# Patient Record
Sex: Female | Born: 1980 | Race: White | Hispanic: No | Smoking: Current every day smoker
Health system: Southern US, Community
[De-identification: ages and names within clinical notes are randomized; demographics above are authoritative.]

## PROBLEM LIST (undated history)

## (undated) DIAGNOSIS — N10 Acute tubulo-interstitial nephritis: Secondary | ICD-10-CM

## (undated) DIAGNOSIS — Z720 Tobacco use: Secondary | ICD-10-CM

## (undated) DIAGNOSIS — N390 Urinary tract infection, site not specified: Secondary | ICD-10-CM

## (undated) DIAGNOSIS — A0472 Enterocolitis due to Clostridium difficile, not specified as recurrent: Secondary | ICD-10-CM

## (undated) DIAGNOSIS — B9620 Unspecified Escherichia coli [E. coli] as the cause of diseases classified elsewhere: Secondary | ICD-10-CM

## (undated) DIAGNOSIS — I959 Hypotension, unspecified: Secondary | ICD-10-CM

## (undated) HISTORY — PX: TUBAL LIGATION: SHX77

---

## 1999-05-04 ENCOUNTER — Other Ambulatory Visit: Admission: RE | Admit: 1999-05-04 | Discharge: 1999-05-04 | Payer: Self-pay | Admitting: Family Medicine

## 1999-05-09 ENCOUNTER — Ambulatory Visit (HOSPITAL_COMMUNITY): Admission: RE | Admit: 1999-05-09 | Discharge: 1999-05-09 | Payer: Self-pay | Admitting: Family Medicine

## 1999-05-09 ENCOUNTER — Encounter: Payer: Self-pay | Admitting: Family Medicine

## 1999-07-25 ENCOUNTER — Encounter: Payer: Self-pay | Admitting: Family Medicine

## 1999-07-25 ENCOUNTER — Ambulatory Visit (HOSPITAL_COMMUNITY): Admission: RE | Admit: 1999-07-25 | Discharge: 1999-07-25 | Payer: Self-pay | Admitting: Family Medicine

## 1999-07-30 ENCOUNTER — Inpatient Hospital Stay (HOSPITAL_COMMUNITY): Admission: AD | Admit: 1999-07-30 | Discharge: 1999-07-30 | Payer: Self-pay | Admitting: Family Medicine

## 1999-09-18 ENCOUNTER — Inpatient Hospital Stay (HOSPITAL_COMMUNITY): Admission: AD | Admit: 1999-09-18 | Discharge: 1999-09-18 | Payer: Self-pay | Admitting: Family Medicine

## 1999-09-18 ENCOUNTER — Encounter: Payer: Self-pay | Admitting: Family Medicine

## 1999-10-16 ENCOUNTER — Inpatient Hospital Stay (HOSPITAL_COMMUNITY): Admission: AD | Admit: 1999-10-16 | Discharge: 1999-10-23 | Payer: Self-pay | Admitting: Family Medicine

## 1999-10-17 ENCOUNTER — Encounter: Payer: Self-pay | Admitting: Family Medicine

## 1999-11-22 ENCOUNTER — Encounter: Payer: Self-pay | Admitting: Family Medicine

## 1999-11-22 ENCOUNTER — Ambulatory Visit (HOSPITAL_COMMUNITY): Admission: RE | Admit: 1999-11-22 | Discharge: 1999-11-22 | Payer: Self-pay | Admitting: Family Medicine

## 1999-12-26 ENCOUNTER — Inpatient Hospital Stay (HOSPITAL_COMMUNITY): Admission: AD | Admit: 1999-12-26 | Discharge: 1999-12-28 | Payer: Self-pay | Admitting: Family Medicine

## 2001-09-23 ENCOUNTER — Other Ambulatory Visit: Admission: RE | Admit: 2001-09-23 | Discharge: 2001-09-23 | Payer: Self-pay | Admitting: *Deleted

## 2002-02-22 ENCOUNTER — Observation Stay (HOSPITAL_COMMUNITY): Admission: AD | Admit: 2002-02-22 | Discharge: 2002-02-23 | Payer: Self-pay | Admitting: Gynecology

## 2002-03-05 ENCOUNTER — Inpatient Hospital Stay (HOSPITAL_COMMUNITY): Admission: AD | Admit: 2002-03-05 | Discharge: 2002-03-08 | Payer: Self-pay | Admitting: Gynecology

## 2004-05-21 ENCOUNTER — Emergency Department (HOSPITAL_COMMUNITY): Admission: EM | Admit: 2004-05-21 | Discharge: 2004-05-21 | Payer: Self-pay | Admitting: Emergency Medicine

## 2004-08-20 ENCOUNTER — Emergency Department (HOSPITAL_COMMUNITY): Admission: EM | Admit: 2004-08-20 | Discharge: 2004-08-20 | Payer: Self-pay | Admitting: Emergency Medicine

## 2004-08-27 ENCOUNTER — Other Ambulatory Visit: Admission: RE | Admit: 2004-08-27 | Discharge: 2004-08-27 | Payer: Self-pay | Admitting: Obstetrics & Gynecology

## 2005-02-18 ENCOUNTER — Ambulatory Visit (HOSPITAL_COMMUNITY): Admission: AD | Admit: 2005-02-18 | Discharge: 2005-02-19 | Payer: Self-pay | Admitting: Obstetrics and Gynecology

## 2005-04-13 ENCOUNTER — Ambulatory Visit (HOSPITAL_COMMUNITY): Admission: AD | Admit: 2005-04-13 | Discharge: 2005-04-14 | Payer: Self-pay | Admitting: Obstetrics and Gynecology

## 2005-05-01 ENCOUNTER — Ambulatory Visit (HOSPITAL_COMMUNITY): Admission: AD | Admit: 2005-05-01 | Discharge: 2005-05-01 | Payer: Self-pay | Admitting: Obstetrics and Gynecology

## 2005-05-24 ENCOUNTER — Inpatient Hospital Stay (HOSPITAL_COMMUNITY): Admission: AD | Admit: 2005-05-24 | Discharge: 2005-05-26 | Payer: Self-pay | Admitting: Obstetrics and Gynecology

## 2005-07-24 ENCOUNTER — Ambulatory Visit (HOSPITAL_COMMUNITY): Admission: RE | Admit: 2005-07-24 | Discharge: 2005-07-24 | Payer: Self-pay | Admitting: Obstetrics & Gynecology

## 2005-08-11 ENCOUNTER — Emergency Department (HOSPITAL_COMMUNITY): Admission: EM | Admit: 2005-08-11 | Discharge: 2005-08-11 | Payer: Self-pay | Admitting: Emergency Medicine

## 2010-05-31 ENCOUNTER — Emergency Department (HOSPITAL_COMMUNITY): Admission: EM | Admit: 2010-05-31 | Discharge: 2010-05-31 | Payer: Self-pay | Admitting: Emergency Medicine

## 2010-09-19 ENCOUNTER — Emergency Department (HOSPITAL_COMMUNITY): Payer: Medicaid Other

## 2010-09-19 ENCOUNTER — Emergency Department (HOSPITAL_COMMUNITY)
Admission: EM | Admit: 2010-09-19 | Discharge: 2010-09-19 | Disposition: A | Discharge status: Home / Self Care | Payer: Medicaid Other | Attending: Emergency Medicine | Admitting: Emergency Medicine

## 2010-09-19 DIAGNOSIS — S91009A Unspecified open wound, unspecified ankle, initial encounter: Secondary | ICD-10-CM | POA: Insufficient documentation

## 2010-09-19 DIAGNOSIS — S51809A Unspecified open wound of unspecified forearm, initial encounter: Secondary | ICD-10-CM | POA: Insufficient documentation

## 2010-09-19 DIAGNOSIS — S81009A Unspecified open wound, unspecified knee, initial encounter: Secondary | ICD-10-CM | POA: Insufficient documentation

## 2010-09-19 DIAGNOSIS — Y92009 Unspecified place in unspecified non-institutional (private) residence as the place of occurrence of the external cause: Secondary | ICD-10-CM | POA: Insufficient documentation

## 2010-09-19 DIAGNOSIS — S31109A Unspecified open wound of abdominal wall, unspecified quadrant without penetration into peritoneal cavity, initial encounter: Secondary | ICD-10-CM | POA: Insufficient documentation

## 2010-09-19 DIAGNOSIS — W540XXA Bitten by dog, initial encounter: Secondary | ICD-10-CM | POA: Insufficient documentation

## 2010-12-07 NOTE — Op Note (Signed)
NAMESUSETTE, SEMINARA               ACCOUNT NO.:  [id number]   MEDICAL RECORD NO.:  [id number]          PATIENT TYPE:  AMB   LOCATION:  DAY                           FACILITY:  [facility]   PHYSICIAN:  Lazaro Arms, M.D.   DATE OF BIRTH:  [birth year]   DATE OF PROCEDURE:  07/24/2005  DATE OF DISCHARGE:                                 OPERATIVE REPORT   PREOPERATIVE DIAGNOSIS:  Multiparous female, desires permanent  sterilization.   POSTOPERATIVE DIAGNOSIS:  Multiparous female, desires permanent  sterilization.   OPERATION PERFORMED:  Laparoscopic tubal ligation.   SURGEON:  Lazaro Arms, M.D.   ANESTHESIA:  General endotracheal.   FINDINGS:  Normal uterus, tubes and ovaries.  Normal pelvis.   DESCRIPTION OF PROCEDURE:  The patient was taken to the operating room and  placed in supine position where she underwent general endotracheal  anesthesia, placed in dorsal lithotomy position, prepped and draped in the  usual sterile fashion.  Incision was made in the umbilicus.  A Veress needle  was placed.  The peritoneal cavity was insufflated under direct vision. A  nonbladed trocar was used and the peritoneal cavity was entered under direct  visualization with the video laparoscope.  Tubes were identified. They were  burned 3 cm in the distal isthmic and ampullary region of both tubes.  No  bleeding bilaterally.  There was normal uterus, tubes and ovaries.  The  instruments were removed, the gas was allowed to escape.  The fascia was  closed using 0 Vicryl.  The skin was closed using 3-0 Vicryl, Dermabond was  placed.  The patient tolerated the procedure well.  She experienced no blood  loss and was taken to recovery room in good stable condition.  All counts  were correct.      Lazaro Arms, M.D.  Electronically Signed     LHE/MEDQ  D:  07/24/2005  T:  07/24/2005  Job:  [id number]

## 2010-12-07 NOTE — Discharge Summary (Signed)
   [name], [name]                           ACCOUNT NO.:  [id number]   MEDICAL RECORD NO.:  [id number]                   PATIENT TYPE:  INP   LOCATION:  9114                                 FACILITY:  [facility]   PHYSICIAN:  Juan H. Lily Peer, M.D.             DATE OF BIRTH:  [birth year]   DATE OF ADMISSION:  03/05/2002  DATE OF DISCHARGE:  03/08/2002                                 DISCHARGE SUMMARY   DISCHARGE DIAGNOSIS:  Intrauterine pregnancy at term.   PROCEDURE:  Normal spontaneous vaginal delivery of viable infant over intact  perineum.   HISTORY OF PRESENT ILLNESS:  The patient is a 30 year old gravida 2 para 1-0-  0-1 with an LMP of  April 25, 2001; [facility] March 02, 2002.  Prenatal course  was complicated by late entry into prenatal care.   LABORATORY DATA:  Blood type A positive, antibody screen negative.  RPR,  HBsAg, HIV nonreactive.  GBS negative.   HOSPITAL COURSE AND TREATMENT:  The patient presented March 06, 2002 with  spontaneous onset of labor, advanced dilatation.  Cervix was 5 cm, 90%, -2  station.  She progressed to complete dilatation, delivered an Apgar 9 and 18  female infant weighing 8 pounds 13 ounces over an intact perineum.   POSTPARTUM COURSE:  The patient remained afebrile, had no difficulty  voiding, was able to be discharged in satisfactory condition on postpartum  day #2.  CBC:  Hematocrit 30.3, hemoglobin 10.3, wbc's 16.7, platelets 219.   DISPOSITION:  1. Follow up in six weeks.  2. Continue prenatal vitamins and iron.  3. Motrin and Tylox for pain.     Elwyn Lade . Hancock, N.P.                Gaetano Hawthorne. Lily Peer, M.D.    MKH/MEDQ  D:  04/16/2002  T:  04/17/2002  Job:  [id number]

## 2010-12-07 NOTE — H&P (Signed)
NAMEDILCIA, RYBARCZYK               ACCOUNT NO.:  [id number]   MEDICAL RECORD NO.:  [id number]          PATIENT TYPE:  AMB   LOCATION:  DAY                           FACILITY:  [facility]   PHYSICIAN:  Lazaro Arms, M.D.   DATE OF BIRTH:  1980/08/28   DATE OF ADMISSION:  DATE OF DISCHARGE:  LH                                HISTORY & PHYSICAL   HISTORY:  Tonya Stewart is a 30 year old white female, gravida 4, para 3, abortus  1, who recently delivered May 24, 2005.  She has requested permanent  sterilization and wants a tubal ligation.  She understands the permanent  nature of the procedure.   PAST MEDICAL HISTORY:  The past medical history is negative.   PAST SURGICAL HISTORY:  The past surgical history is negative.   PAST OBSTETRICAL HISTORY:  The patient has had three vaginal deliveries and  a miscarriage.   ALLERGIES:  The patient has no known drug allergies.   MEDICATIONS:  The patient is on no medications.   REVIEW OF SYSTEMS:  The review of systems is otherwise negative.   PHYSICAL EXAMINATION:  HEENT:  The head, eyes, ears, nose and throat is  unremarkable.  NECK:  The thyroid is normal.  LUNGS:  The lungs are clear.  HEART:  The heart shows a regular rate and rhythm without regurgitation or  gallop.  BREASTS:  The breasts are without mass, discharge or skin changes.  ABDOMEN:  The abdomen is benign with no hepatosplenomegaly or masses.  PELVIC:  The patient has normal external genitalia.  EXTREMITIES:  The extremities are warm with no edema.  NEUROLOGIC:  The neurological exam is grossly intact.   IMPRESSION:  1.  Multiparous female.  2.  Desires permanent sterilization.   PLAN:  The patient is admitted for a laparoscopic tubal ligation.   The patient understands the permanent of the procedure and the failure rate  of 1 in 300.      Lazaro Arms, M.D.  Electronically Signed     LHE/MEDQ  D:  07/23/2005  T:  07/24/2005  Job:  [id number]

## 2010-12-07 NOTE — Discharge Summary (Signed)
[facility]  Patient:    Tonya Stewart, Tonya Stewart                        MRN: [id number] Adm. Date:  [id number] Disc. Date: [id number] Attending:  Orpah Greek                           Discharge Summary  DATE OF BIRTH:                [birth year]  ADMISSION DIAGNOSIS:          A 30 year old G1, P0 at 30-1/7 weeks with preterm  labor.  DISCHARGE DIAGNOSES:          1. A 30 year old G1, P0 at 32 weeks.                               2. Preterm labor, stable.  PROCEDURES:                   Ultrasound October 17, 1999 revealing a single living IUP, mean gestation age 82 weeks and 5 days (2 weeks 3 days ahead of LMP) estimated fetal weight of 1974 g and an AFI of 25.2 cm.  DISCHARGE INSTRUCTIONS:       1. The patient was discharged home on strict bedrest                                  with shower and bathroom privileges only.                               2. She was to follow up in the office in one week,                                  sooner p.r.n. greater than four uterine                                  contractions per hour or increasing pelvic                                  pressure.  She was to follow pelvic rest, as well                                  as bedrest.  DISCHARGE MEDICATIONS:        1. Procardia XL 60 mg p.o. q.p.m. at 4 oclock                               2. Ambien 10 mg p.o. q.h.s. p.r.n.                               3. Paxil 10 mg p.o. q.a.m.  4. PNV p.o. q.d.                               5. Colace 100 mg p.o. q.d.  DISCHARGE SUMMARY:            A 30 year old G1, P0 at 30-1/7 weeks by LMP and confirmed by ultrasound at 7 weeks 2 days, referred for admission after she was  found to have cervical change during a routine office visit.  She had had episodes of uterine contractions three days prior to discharge and increased white vaginal discharge.  Pregnancy had already been complicated by  preterm cervical change earlier with some dilatation and developing fetal macrosomia.  A one-hour ______ had been normal at 88 and all other labs for the patient had p to this point been normal.  At the time of diagnosis, her cervical exam was a loose 1 cm anterior, soft, long and high.  She was admitted and started on Unasyn 3 g IV q.6h. because of her preterm status and the vaginal discharge, placed on bedrest with bathroom privileges, and given betamethasone 12.5 mg IM.  She was initially treated with terbutaline subcu x 1 but still continued to contract, and so she was started on magnesium sulfate in addition to the Unasyn.  She responded well to this and was maintained on this medication until 24 hours prior to discharge.  She was given betamethasone x 2 t the beginning of her hospitalization.  During the first 24 hours, her cervical exam changed to a loose 1 cm, dilated 60-80% effaced, and increased lower uterine segment development.  Presenting problem was ballotable.  This exam remained stable throughout the remainder of er hospitalization including the day of discharge.  Ultrasound did reveal accelerated fetal growth with a 2 week 5 day difference between her initial seven-week ultrasound and size noted on the ultrasound on October 17, 1999.  She did undergo a three-hour OGTT the third day of hospitalization that revealed elevated fasting  sugar of 108, one-hour of 177, two-hour of 166, and three-hour of 147.  Fasting  blood sugars throughout the remainder of her hospitalization remained less than 75 and it was thought the initial abnormal results were because of the steroid she was given.  The patient was switched to Procardia XL 60 mg q.d. on October 22, 1999. he did have several episodes of contractions in the afternoon, as many as nine contractions an hour.  This was not treated and did resolve spontaneously.  She  sustained no cervical change.  She was  discharged home on October 23, 1999 on strict bedrest, Procardia XL, and Paxil for depressive symptoms that had developed during her hospitalization.  She was  advised to limit her sweets and fat intake in her diet and to follow up in the office one week or sooner if needed.  During her hospitalization, her ______ strip status remained negative, RPR was negative, and her wet prep was negative.  A urine was also negative. DD:  10/23/99 TD:  10/25/99 Job: [id number]

## 2010-12-07 NOTE — Op Note (Signed)
NAMEBLAYNE, GARLICK               ACCOUNT NO.:  [id number]   MEDICAL RECORD NO.:  [id number]          PATIENT TYPE:  INP   LOCATION:  LDR1                          FACILITY:  [facility]   PHYSICIAN:  Tilda Burrow, M.D. DATE OF BIRTH:  27-Apr-1981   DATE OF PROCEDURE:  05/24/2005  DATE OF DISCHARGE:                                 OPERATIVE REPORT   PROCEDURE:  Continuous lumbar epidural catheter placed at 1:45 p.m. using  loss-of-resistance technique. We first had difficulty identifying the  interspace, and on the first time that we legitimately identified the  interspace, it was at the L3-4 interspace with loss of resistance  identifying the epidural space at 6 cm depth beneath the skin. A 5-cc test  dose of 1.5% lidocaine with epinephrine was instilled followed by insertion  of the epidural catheter 3.5 cm into the epidural space with taping of the  catheter to the back after removal of the Tuohy needle. The patient then had  continuous infusion initiated with good analgesic effect beginning at the  time of this dictation. The patient will receive a 12 cc continuous infusion  and will not require an additional bolus as she is laboring easily at the  current time.      Tilda Burrow, M.D.  Electronically Signed     JVF/MEDQ  D:  05/24/2005  T:  05/24/2005  Job:  [id number]

## 2010-12-07 NOTE — Op Note (Signed)
NAMESUSETTE, SEMINARA               ACCOUNT NO.:  [id number]   MEDICAL RECORD NO.:  [id number]          PATIENT TYPE:  INP   LOCATION:  LDR1                          FACILITY:  [facility]   PHYSICIAN:  Tilda Burrow, M.D. DATE OF BIRTH:  05/25/1981   DATE OF PROCEDURE:  05/24/2005  DATE OF DISCHARGE:                                 OPERATIVE REPORT   Tonya Stewart, R.N., went to put her scheduled Foley catheter after her  epidural, and when the catheter would not thread, she noticed the head to be  at about +3 station. I was called to attend the delivery, and soon as I came  over, she pushed twice and had a spontaneous vaginal delivery of a viable  female infant at 24. There was a nuchal cord x1 that I could not reduce  before delivery of the body, so the body was delivered through it. Weight is  6 pounds 14 ounces. Apgars are 9 and 9. Twenty units of Pitocin diluted in  1,000 cc of lactated ringers was infused rapidly IV. The placenta separated  spontaneously and delivered via controlled cord traction at 1500. It was  inspected and appears to be intact with a three-vessel cord. The fundus was  firm. Minimal blood loss was noted. The vagina was inspected, and no  lacerations were found. The epidural catheter was then removed with the blue  tip visualized as being intact.      Tonya Stewart, C.N.M.      Tilda Burrow, M.D.  Electronically Signed    FC/MEDQ  D:  05/24/2005  T:  05/24/2005  Job:  [id number]   cc:   [facility]

## 2010-12-07 NOTE — H&P (Signed)
Tonya Stewart               ACCOUNT NO.:  [id number]   MEDICAL RECORD NO.:  [id number]          PATIENT TYPE:  INP   LOCATION:  LDR1                          FACILITY:  [facility]   PHYSICIAN:  Tonya Stewart, M.D. DATE OF BIRTH:  01/04/1981   DATE OF ADMISSION:  05/24/2005  DATE OF DISCHARGE:  LH                                HISTORY & PHYSICAL   CHIEF COMPLAINT:  Regular contractions.   HISTORY OF PRESENT ILLNESS:  Tonya Stewart is a 30 year old gravida 4, para 2, AB 1  with EDC of June 03, 2005 based on last known menstrual period and first  and second trimester ultrasound's which correlate nicely placing her at 38  weeks, 4 days gestation.  She began prenatal care early in her first  trimester and has had regular visits throughout.  Her pregnancy course has  been basically uncomplicated.   LABORATORY DATA:  Prenatal labs blood type A positive, rubella immune.  HBsAg, HIV, HSV, RPR, Gonorrhea and Chlamydia are all negative.  Her Group B  Strep is also negative.   PRENATAL HISTORY:  Her blood pressures have been 100-130's/60-80's.  Total  weight gain has been 30 pounds with appropriate fundal height growth.   PAST MEDICAL HISTORY:  Noncontributory.   PAST SURGICAL HISTORY:  No surgical history.   HABITS:  Patient smokes about one-half pack per day.  Denies alcohol or  recreational drug use.   SOCIAL HISTORY:  She is separated from her husband.  She lives with Sharia Reeve,  the father of this baby.  Her two other children live with her ex-husband.   FAMILY HISTORY:  Her mother was diagnosed with breast cancer at age 24.   OBSTETRICAL HISTORY:  In 2001 she had an induced labor at 40 weeks and had a  spontaneous vaginal delivery of an 8 pounds, 4 ounces female.  That pregnancy  had been complicated by dilating to 4 cm real early on in the pregnancy, 30  weeks or so.  In August 2003 she also had some preterm cervical changes but  ended up delivering at about 40 weeks with spontaneous  vaginal delivery of  another female infant.  This one was 8 pounds, 14 ounces.  In 2004 she had a  TAB.   PHYSICAL EXAMINATION:  HEENT:  Within normal limits.  HEART:  Regular rate and rhythm.  LUNGS:  Clear.  ABDOMEN:  Soft, nontender.  She is having some mild irregular contractions.  Fetal heart rate is reactive without decelerations.  PELVIC:  Cervical examination at the office the other day was 3 cm, 80%, 0  station.  This morning, would give her 4 cm, feels a little thicker, maybe  50%, 0 to -1 with a bulging bag/membrane.   IMPRESSION:  Intrauterine pregnancy at 38-1/2 weeks, beginning active phase  labor.   PLAN:  Will see what she does this morning.  If she has not progressed a lot  by lunch time will go ahead and break her water and start Pitocin.      Tonya Stewart, C.N.M.      Tonya Stewart, M.D.  Electronically Signed    FC/MEDQ  D:  05/24/2005  T:  05/24/2005  Job:  [id number]

## 2010-12-22 ENCOUNTER — Emergency Department (HOSPITAL_COMMUNITY): Payer: BC Managed Care – PPO

## 2010-12-22 ENCOUNTER — Emergency Department (HOSPITAL_COMMUNITY)
Admission: EM | Admit: 2010-12-22 | Discharge: 2010-12-22 | Disposition: A | Discharge status: Home / Self Care | Payer: BC Managed Care – PPO | Attending: Emergency Medicine | Admitting: Emergency Medicine

## 2010-12-22 DIAGNOSIS — F329 Major depressive disorder, single episode, unspecified: Secondary | ICD-10-CM | POA: Insufficient documentation

## 2010-12-22 DIAGNOSIS — K5289 Other specified noninfective gastroenteritis and colitis: Secondary | ICD-10-CM | POA: Insufficient documentation

## 2010-12-22 DIAGNOSIS — F3289 Other specified depressive episodes: Secondary | ICD-10-CM | POA: Insufficient documentation

## 2010-12-22 DIAGNOSIS — Z79899 Other long term (current) drug therapy: Secondary | ICD-10-CM | POA: Insufficient documentation

## 2010-12-22 DIAGNOSIS — R10811 Right upper quadrant abdominal tenderness: Secondary | ICD-10-CM | POA: Insufficient documentation

## 2010-12-22 DIAGNOSIS — F411 Generalized anxiety disorder: Secondary | ICD-10-CM | POA: Insufficient documentation

## 2010-12-22 LAB — URINALYSIS, ROUTINE W REFLEX MICROSCOPIC
Ketones, ur: 15 mg/dL — AB
Leukocytes, UA: NEGATIVE
Nitrite: NEGATIVE
Protein, ur: 30 mg/dL — AB

## 2010-12-22 LAB — COMPREHENSIVE METABOLIC PANEL
Alkaline Phosphatase: 86 U/L (ref 39–117)
BUN: 13 mg/dL (ref 6–23)
CO2: 29 mEq/L (ref 19–32)
Chloride: 96 mEq/L (ref 96–112)
Creatinine, Ser: 0.78 mg/dL (ref 0.4–1.2)
GFR calc non Af Amer: >60 mL/min (ref >60)
Glucose, Bld: 103 mg/dL — ABNORMAL HIGH (ref 70–99)
Potassium: 3.6 mEq/L (ref 3.5–5.1)
Total Bilirubin: 0.8 mg/dL (ref 0.3–1.2)

## 2010-12-22 LAB — URINE MICROSCOPIC-ADD ON

## 2010-12-22 LAB — DIFFERENTIAL
Basophils Absolute: 0 10*3/uL (ref 0.0–0.1)
Basophils Relative: 0 % (ref 0–1)
Eosinophils Relative: 0 % (ref 0–5)
Monocytes Absolute: 0.8 10*3/uL (ref 0.1–1.0)
Neutro Abs: 10 10*3/uL — ABNORMAL HIGH (ref 1.7–7.7)

## 2010-12-22 LAB — LIPASE, BLOOD: Lipase: 15 U/L (ref 11–59)

## 2010-12-22 LAB — CBC
MCH: 32.9 pg (ref 26.0–34.0)
MCV: 95.2 fL (ref 78.0–100.0)
Platelets: 272 10*3/uL (ref 150–400)

## 2010-12-22 LAB — AMYLASE: Amylase: 34 U/L (ref 0–105)

## 2010-12-22 MED ORDER — IOHEXOL 300 MG/ML  SOLN
100.0000 mL | Freq: Once | INTRAMUSCULAR | Status: AC | PRN
  Administered 2010-12-22: 100 mL via INTRAVENOUS

## 2011-01-21 ENCOUNTER — Emergency Department (HOSPITAL_COMMUNITY)
Admission: EM | Admit: 2011-01-21 | Discharge: 2011-01-22 | Disposition: A | Discharge status: Home / Self Care | Payer: BC Managed Care – PPO | Attending: Emergency Medicine | Admitting: Emergency Medicine

## 2011-01-21 DIAGNOSIS — F1210 Cannabis abuse, uncomplicated: Secondary | ICD-10-CM | POA: Insufficient documentation

## 2011-01-21 DIAGNOSIS — Z79899 Other long term (current) drug therapy: Secondary | ICD-10-CM | POA: Insufficient documentation

## 2011-01-21 DIAGNOSIS — T07XXXA Unspecified multiple injuries, initial encounter: Secondary | ICD-10-CM | POA: Insufficient documentation

## 2011-01-22 ENCOUNTER — Emergency Department (HOSPITAL_COMMUNITY): Payer: BC Managed Care – PPO

## 2011-01-22 LAB — URINALYSIS, ROUTINE W REFLEX MICROSCOPIC
Glucose, UA: NEGATIVE mg/dL
Hgb urine dipstick: NEGATIVE
Leukocytes, UA: NEGATIVE
Protein, ur: 30 mg/dL — AB
Specific Gravity, Urine: 1.01 (ref 1.005–1.030)
Urobilinogen, UA: 0.2 mg/dL (ref 0.0–1.0)

## 2011-01-22 LAB — URINE MICROSCOPIC-ADD ON

## 2012-06-16 ENCOUNTER — Other Ambulatory Visit (HOSPITAL_COMMUNITY)
Admission: RE | Admit: 2012-06-16 | Discharge: 2012-06-16 | Disposition: A | Discharge status: Home / Self Care | Payer: Commercial Indemnity | Source: Ambulatory Visit | Attending: Unknown Physician Specialty | Admitting: Unknown Physician Specialty

## 2012-06-16 ENCOUNTER — Other Ambulatory Visit: Payer: Self-pay | Admitting: Nurse Practitioner

## 2012-06-16 ENCOUNTER — Other Ambulatory Visit (HOSPITAL_COMMUNITY)
Admission: RE | Admit: 2012-06-16 | Discharge: 2012-06-16 | Disposition: A | Discharge status: Home / Self Care | Payer: BC Managed Care – PPO | Source: Ambulatory Visit | Attending: Unknown Physician Specialty | Admitting: Unknown Physician Specialty

## 2012-06-16 DIAGNOSIS — R87619 Unspecified abnormal cytological findings in specimens from cervix uteri: Secondary | ICD-10-CM | POA: Insufficient documentation

## 2012-06-16 DIAGNOSIS — R87612 Low grade squamous intraepithelial lesion on cytologic smear of cervix (LGSIL): Secondary | ICD-10-CM | POA: Insufficient documentation

## 2012-12-08 ENCOUNTER — Emergency Department (HOSPITAL_COMMUNITY)
Admission: EM | Admit: 2012-12-08 | Discharge: 2012-12-08 | Disposition: A | Discharge status: Home / Self Care | Payer: Medicaid Other | Attending: Emergency Medicine | Admitting: Emergency Medicine

## 2012-12-08 ENCOUNTER — Encounter (HOSPITAL_COMMUNITY): Payer: Self-pay | Admitting: *Deleted

## 2012-12-08 DIAGNOSIS — Z9851 Tubal ligation status: Secondary | ICD-10-CM | POA: Insufficient documentation

## 2012-12-08 DIAGNOSIS — R05 Cough: Secondary | ICD-10-CM | POA: Insufficient documentation

## 2012-12-08 DIAGNOSIS — E860 Dehydration: Secondary | ICD-10-CM

## 2012-12-08 DIAGNOSIS — N390 Urinary tract infection, site not specified: Secondary | ICD-10-CM | POA: Insufficient documentation

## 2012-12-08 DIAGNOSIS — F17200 Nicotine dependence, unspecified, uncomplicated: Secondary | ICD-10-CM | POA: Insufficient documentation

## 2012-12-08 DIAGNOSIS — R112 Nausea with vomiting, unspecified: Secondary | ICD-10-CM | POA: Insufficient documentation

## 2012-12-08 DIAGNOSIS — R1110 Vomiting, unspecified: Secondary | ICD-10-CM

## 2012-12-08 DIAGNOSIS — R1084 Generalized abdominal pain: Secondary | ICD-10-CM | POA: Insufficient documentation

## 2012-12-08 DIAGNOSIS — R059 Cough, unspecified: Secondary | ICD-10-CM | POA: Insufficient documentation

## 2012-12-08 DIAGNOSIS — J3489 Other specified disorders of nose and nasal sinuses: Secondary | ICD-10-CM | POA: Insufficient documentation

## 2012-12-08 DIAGNOSIS — R197 Diarrhea, unspecified: Secondary | ICD-10-CM | POA: Insufficient documentation

## 2012-12-08 LAB — CBC WITH DIFFERENTIAL/PLATELET
Basophils Absolute: 0.1 10*3/uL (ref 0.0–0.1)
Basophils Relative: 0 % (ref 0–1)
Eosinophils Absolute: 0 10*3/uL (ref 0.0–0.7)
Eosinophils Relative: 0 % (ref 0–5)
HCT: 39.4 % (ref 36.0–46.0)
MCHC: 35 g/dL (ref 30.0–36.0)
MCV: 95.6 fL (ref 78.0–100.0)
Monocytes Absolute: 1.2 10*3/uL — ABNORMAL HIGH (ref 0.1–1.0)
Platelets: 275 10*3/uL (ref 150–400)
RDW: 12.5 % (ref 11.5–15.5)
WBC: 13.4 10*3/uL — ABNORMAL HIGH (ref 4.0–10.5)

## 2012-12-08 LAB — COMPREHENSIVE METABOLIC PANEL
ALT: 11 U/L (ref 0–35)
AST: 14 U/L (ref 0–37)
Albumin: 4.3 g/dL (ref 3.5–5.2)
Calcium: 9.9 mg/dL (ref 8.4–10.5)
Creatinine, Ser: 0.73 mg/dL (ref 0.50–1.10)
GFR calc non Af Amer: >90 mL/min (ref >90)
Sodium: 140 mEq/L (ref 135–145)
Total Protein: 7.3 g/dL (ref 6.0–8.3)

## 2012-12-08 LAB — URINALYSIS, ROUTINE W REFLEX MICROSCOPIC
Glucose, UA: NEGATIVE mg/dL
Specific Gravity, Urine: 1.015 (ref 1.005–1.030)
Urobilinogen, UA: 0.2 mg/dL (ref 0.0–1.0)
pH: 7.5 (ref 5.0–8.0)

## 2012-12-08 LAB — URINE MICROSCOPIC-ADD ON

## 2012-12-08 MED ORDER — DIPHENOXYLATE-ATROPINE 2.5-0.025 MG PO TABS: 1.0000 | ORAL_TABLET | Freq: Four times a day (QID) | ORAL | Status: DC | PRN

## 2012-12-08 MED ORDER — ONDANSETRON HCL 4 MG/2ML IJ SOLN
4.0000 mg | Freq: Once | INTRAMUSCULAR | Status: AC
  Administered 2012-12-08: 4 mg via INTRAVENOUS
  Filled 2012-12-08: qty 2

## 2012-12-08 MED ORDER — NITROFURANTOIN MONOHYD MACRO 100 MG PO CAPS: 100.0000 mg | ORAL_CAPSULE | Freq: Two times a day (BID) | ORAL | Status: DC

## 2012-12-08 MED ORDER — POTASSIUM CHLORIDE CRYS ER 20 MEQ PO TBCR
40.0000 meq | EXTENDED_RELEASE_TABLET | Freq: Once | ORAL | Status: AC
  Administered 2012-12-08: 40 meq via ORAL
  Filled 2012-12-08: qty 2

## 2012-12-08 MED ORDER — SODIUM CHLORIDE 0.9 % IV BOLUS (SEPSIS)
500.0000 mL | Freq: Once | INTRAVENOUS | Status: AC
  Administered 2012-12-08: 500 mL via INTRAVENOUS

## 2012-12-08 MED ORDER — SODIUM CHLORIDE 0.9 % IV BOLUS (SEPSIS)
1000.0000 mL | Freq: Once | INTRAVENOUS | Status: AC
  Administered 2012-12-08: 1000 mL via INTRAVENOUS

## 2012-12-08 MED ORDER — ONDANSETRON HCL 4 MG PO TABS: 4.0000 mg | ORAL_TABLET | Freq: Four times a day (QID) | ORAL | Status: DC

## 2012-12-08 NOTE — ED Provider Notes (Signed)
History     This chart was scribed for Gilda Crease, *, MD by Smitty Pluck, ED Scribe. The patient was seen in room APA19/APA19 and the patient's care was started at 11:17 AM.   CSN: [id number]  Arrival date & time 12/08/12  1046      Chief Complaint  Patient presents with  . Emesis    The history is provided by the patient and medical records. No language interpreter was used.   HPI Comments: Tonya Stewart is a 32 y.o. female who presents to the Emergency Department complaining of constant, moderate nausea onset 2 days ago. Pt reports that she has vomited multiple times since onset but vomiting has subsided today. She reports that she had diarrhea, mild generalized abdominal pain, cough and congestion. She denies known sick contact. Pt denies fever, chills, weakness, SOB and any other pain.    History reviewed. No pertinent past medical history.  Past Surgical History  Procedure Laterality Date  . Tubal ligation      History reviewed. No pertinent family history.  History  Substance Use Topics  . Smoking status: Current Every Day Smoker  . Smokeless tobacco: Not on file  . Alcohol Use: No    OB History   Grav Para Term Preterm Abortions TAB SAB Ect Mult Living                  Review of Systems  Constitutional: Negative for fever and chills.  HENT: Positive for congestion.   Respiratory: Positive for cough.   Gastrointestinal: Positive for nausea, vomiting, abdominal pain and diarrhea.  All other systems reviewed and are negative.    Allergies  Hydrocodone and Other  Home Medications  No current outpatient prescriptions on file.  BP 108/63  Pulse 71  Temp(Src) 98.2 F (36.8 C) (Oral)  Resp 20  Ht 5\' 4"  (1.626 m)  Wt 180 lb (81.647 kg)  BMI 30.88 kg/m2  SpO2 100%  LMP 12/07/2012  Physical Exam  Nursing note and vitals reviewed. Constitutional: She is oriented to person, place, and time. She appears well-developed and well-nourished. No  distress.  HENT:  Head: Normocephalic and atraumatic.  Right Ear: Hearing normal.  Left Ear: Hearing normal.  Nose: Nose normal.  Mouth/Throat: Mucous membranes are dry.  Eyes: Conjunctivae and EOM are normal. Pupils are equal, round, and reactive to light.  Neck: Normal range of motion. Neck supple.  Cardiovascular: Regular rhythm, S1 normal and S2 normal.  Exam reveals no gallop and no friction rub.   No murmur heard. Pulmonary/Chest: Effort normal and breath sounds normal. No respiratory distress. She exhibits no tenderness.  Abdominal: Soft. Normal appearance and bowel sounds are normal. There is no hepatosplenomegaly. There is no tenderness. There is no rebound, no guarding, no tenderness at McBurney's point and negative Murphy's sign. No hernia.  Musculoskeletal: Normal range of motion.  Neurological: She is alert and oriented to person, place, and time. She has normal strength. No cranial nerve deficit or sensory deficit. Coordination normal. GCS eye subscore is 4. GCS verbal subscore is 5. GCS motor subscore is 6.  Skin: Skin is warm, dry and intact. No rash noted. No cyanosis.  Psychiatric: She has a normal mood and affect. Her speech is normal and behavior is normal. Thought content normal.    ED Course  Procedures (including critical care time) DIAGNOSTIC STUDIES: Oxygen Saturation is 100% on room air, normal by my interpretation.    COORDINATION OF CARE: 11:19 AM Discussed ED  treatment with pt and pt agrees.     Labs Reviewed  CBC WITH DIFFERENTIAL - Abnormal; Notable for the following:    WBC 13.4 (*)    Neutro Abs 10.1 (*)    Monocytes Absolute 1.2 (*)    All other components within normal limits  COMPREHENSIVE METABOLIC PANEL - Abnormal; Notable for the following:    Potassium 2.8 (*)    Glucose, Bld 104 (*)    All other components within normal limits  URINALYSIS, ROUTINE W REFLEX MICROSCOPIC   No results found.   Diagnosis: 1. Vomiting 2. Diarrhea 3.  Dehydration 4. UTI    MDM  Presents to the ER for evaluation of nausea, vomiting, diarrhea. Patient has had symptoms for several days. She appeared clinically dehydrated he was aggressively hydrated here in the ER. Her blood work showed hypokalemia, initiation of oral replacement in the ER. Abdominal exam, however, was benign. She does not have any guarding or rebound. No right lower quadrant tenderness and no tenderness at McBurney's point. She is not expressing any right upper quadrant tenderness, negative Murphy's sign. Patient feeling much better after IV hydration. She'll be discharged with continued symptomatic treatment. Urinalysis does suggest infection. Will be treated.   I personally performed the services described in this documentation, which was scribed in my presence. The recorded information has been reviewed and is accurate.   Gilda Crease, MD 12/08/12 1415

## 2012-12-08 NOTE — ED Notes (Addendum)
Vomiting for 48 hours, diarrhea has stopped Seen at Urgent care last night night . Sent to [facility] ER , waited there 4 hours and left.  Concerned about dehydration .Body aches  Sprayed her home for fleas on Saturday

## 2012-12-08 NOTE — ED Notes (Addendum)
Pt c/o n/v for two days, was seen at urgent care yesterday,  is not any better, Dr. Blinda Leatherwood at bedside prior to RN, see edp assessment for further.

## 2012-12-08 NOTE — ED Notes (Signed)
Dr Pollina at bedside,  

## 2012-12-08 NOTE — ED Notes (Signed)
Second bag of NS started, pt still unable to obtain urine sample, pt and family updated on plan of care.

## 2012-12-10 LAB — URINE CULTURE

## 2012-12-11 ENCOUNTER — Encounter (HOSPITAL_COMMUNITY): Payer: Self-pay | Admitting: *Deleted

## 2012-12-11 ENCOUNTER — Emergency Department (HOSPITAL_COMMUNITY): Payer: Medicaid Other

## 2012-12-11 ENCOUNTER — Inpatient Hospital Stay (HOSPITAL_COMMUNITY)
Admission: EM | Admit: 2012-12-11 | Discharge: 2012-12-14 | DRG: 872 | Disposition: A | Discharge status: Home / Self Care | Payer: Medicaid Other | Attending: Internal Medicine | Admitting: Internal Medicine

## 2012-12-11 DIAGNOSIS — E876 Hypokalemia: Secondary | ICD-10-CM | POA: Diagnosis present

## 2012-12-11 DIAGNOSIS — R1110 Vomiting, unspecified: Secondary | ICD-10-CM

## 2012-12-11 DIAGNOSIS — B9620 Unspecified Escherichia coli [E. coli] as the cause of diseases classified elsewhere: Secondary | ICD-10-CM

## 2012-12-11 DIAGNOSIS — E860 Dehydration: Secondary | ICD-10-CM | POA: Diagnosis present

## 2012-12-11 DIAGNOSIS — R1084 Generalized abdominal pain: Secondary | ICD-10-CM | POA: Diagnosis present

## 2012-12-11 DIAGNOSIS — F17200 Nicotine dependence, unspecified, uncomplicated: Secondary | ICD-10-CM | POA: Diagnosis present

## 2012-12-11 DIAGNOSIS — A4151 Sepsis due to Escherichia coli [E. coli]: Principal | ICD-10-CM | POA: Diagnosis present

## 2012-12-11 DIAGNOSIS — I959 Hypotension, unspecified: Secondary | ICD-10-CM

## 2012-12-11 DIAGNOSIS — R112 Nausea with vomiting, unspecified: Secondary | ICD-10-CM | POA: Diagnosis present

## 2012-12-11 DIAGNOSIS — N10 Acute tubulo-interstitial nephritis: Secondary | ICD-10-CM

## 2012-12-11 DIAGNOSIS — R197 Diarrhea, unspecified: Secondary | ICD-10-CM | POA: Diagnosis present

## 2012-12-11 DIAGNOSIS — Z720 Tobacco use: Secondary | ICD-10-CM

## 2012-12-11 DIAGNOSIS — I498 Other specified cardiac arrhythmias: Secondary | ICD-10-CM | POA: Diagnosis present

## 2012-12-11 DIAGNOSIS — A419 Sepsis, unspecified organism: Secondary | ICD-10-CM | POA: Diagnosis present

## 2012-12-11 DIAGNOSIS — A498 Other bacterial infections of unspecified site: Secondary | ICD-10-CM

## 2012-12-11 DIAGNOSIS — N390 Urinary tract infection, site not specified: Secondary | ICD-10-CM

## 2012-12-11 HISTORY — DX: Unspecified Escherichia coli (E. coli) as the cause of diseases classified elsewhere: B96.20

## 2012-12-11 HISTORY — DX: Tobacco use: Z72.0

## 2012-12-11 HISTORY — DX: Urinary tract infection, site not specified: N39.0

## 2012-12-11 HISTORY — DX: Acute pyelonephritis: N10

## 2012-12-11 HISTORY — DX: Hypotension, unspecified: I95.9

## 2012-12-11 HISTORY — DX: Urinary tract infection, site not specified: B96.20

## 2012-12-11 LAB — CBC WITH DIFFERENTIAL/PLATELET
Basophils Relative: 0 % (ref 0–1)
Eosinophils Relative: 0 % (ref 0–5)
HCT: 44.2 % (ref 36.0–46.0)
Hemoglobin: 16.2 g/dL — ABNORMAL HIGH (ref 12.0–15.0)
Lymphs Abs: 1.6 10*3/uL (ref 0.7–4.0)
MCH: 33.5 pg (ref 26.0–34.0)
MCV: 91.5 fL (ref 78.0–100.0)
Monocytes Absolute: 0.7 10*3/uL (ref 0.1–1.0)
Monocytes Relative: 6 % (ref 3–12)
Neutro Abs: 9.4 10*3/uL — ABNORMAL HIGH (ref 1.7–7.7)
RBC: 4.83 MIL/uL (ref 3.87–5.11)

## 2012-12-11 LAB — COMPREHENSIVE METABOLIC PANEL
CO2: 32 mEq/L (ref 19–32)
Calcium: 9.8 mg/dL (ref 8.4–10.5)
Chloride: 93 mEq/L — ABNORMAL LOW (ref 96–112)
Creatinine, Ser: 0.86 mg/dL (ref 0.50–1.10)
GFR calc Af Amer: >90 mL/min (ref >90)
GFR calc non Af Amer: 88 mL/min — ABNORMAL LOW (ref >90)
Glucose, Bld: 101 mg/dL — ABNORMAL HIGH (ref 70–99)
Total Bilirubin: 0.5 mg/dL (ref 0.3–1.2)

## 2012-12-11 LAB — CBC
Hemoglobin: 13.3 g/dL (ref 12.0–15.0)
MCHC: 35.8 g/dL (ref 30.0–36.0)
RBC: 4.06 MIL/uL (ref 3.87–5.11)
WBC: 14.1 10*3/uL — ABNORMAL HIGH (ref 4.0–10.5)

## 2012-12-11 LAB — LACTIC ACID, PLASMA: Lactic Acid, Venous: 1.1 mmol/L (ref 0.5–2.2)

## 2012-12-11 LAB — MAGNESIUM: Magnesium: 2.3 mg/dL (ref 1.5–2.5)

## 2012-12-11 MED ORDER — ACETAMINOPHEN 325 MG PO TABS
650.0000 mg | ORAL_TABLET | Freq: Four times a day (QID) | ORAL | Status: DC | PRN
  Administered 2012-12-12: 650 mg via ORAL
  Filled 2012-12-11: qty 2

## 2012-12-11 MED ORDER — ENOXAPARIN SODIUM 40 MG/0.4ML ~~LOC~~ SOLN
40.0000 mg | SUBCUTANEOUS | Status: DC
  Administered 2012-12-11 – 2012-12-13 (×3): 40 mg via SUBCUTANEOUS
  Filled 2012-12-11 (×3): qty 0.4

## 2012-12-11 MED ORDER — METOCLOPRAMIDE HCL 5 MG/ML IJ SOLN
10.0000 mg | Freq: Once | INTRAMUSCULAR | Status: AC
  Administered 2012-12-11: 10 mg via INTRAVENOUS
  Filled 2012-12-11: qty 2

## 2012-12-11 MED ORDER — POTASSIUM CHLORIDE IN NACL 40-0.9 MEQ/L-% IV SOLN
INTRAVENOUS | Status: DC
  Administered 2012-12-11: 150 mL/h via INTRAVENOUS
  Administered 2012-12-12: 01:00:00 via INTRAVENOUS
  Administered 2012-12-12: 150 mL/h via INTRAVENOUS
  Administered 2012-12-12 – 2012-12-13 (×2): via INTRAVENOUS
  Administered 2012-12-13: 75 mL/h via INTRAVENOUS

## 2012-12-11 MED ORDER — ONDANSETRON HCL 4 MG/2ML IJ SOLN
4.0000 mg | Freq: Four times a day (QID) | INTRAMUSCULAR | Status: DC
  Administered 2012-12-11 – 2012-12-14 (×10): 4 mg via INTRAVENOUS
  Filled 2012-12-11 (×10): qty 2

## 2012-12-11 MED ORDER — SODIUM CHLORIDE 0.9 % IV BOLUS (SEPSIS)
500.0000 mL | Freq: Once | INTRAVENOUS | Status: AC
  Administered 2012-12-11: 500 mL via INTRAVENOUS

## 2012-12-11 MED ORDER — SODIUM CHLORIDE 0.9 % IV SOLN: INTRAVENOUS | Status: DC

## 2012-12-11 MED ORDER — ONDANSETRON HCL 4 MG/2ML IJ SOLN
4.0000 mg | Freq: Once | INTRAMUSCULAR | Status: AC
  Administered 2012-12-11: 4 mg via INTRAVENOUS
  Filled 2012-12-11: qty 2

## 2012-12-11 MED ORDER — DEXTROSE 5 % IV SOLN
INTRAVENOUS | Status: AC
  Administered 2012-12-11: 1 g via INTRAVENOUS
  Filled 2012-12-11: qty 10

## 2012-12-11 MED ORDER — DEXTROSE 5 % IV SOLN: 1.0000 g | Freq: Once | INTRAVENOUS | Status: DC

## 2012-12-11 MED ORDER — ACETAMINOPHEN 650 MG RE SUPP: 650.0000 mg | Freq: Four times a day (QID) | RECTAL | Status: DC | PRN

## 2012-12-11 MED ORDER — DEXTROSE 5 % IV SOLN: 1.0000 g | Freq: Once | INTRAVENOUS | Status: AC

## 2012-12-11 MED ORDER — PANTOPRAZOLE SODIUM 40 MG IV SOLR
40.0000 mg | Freq: Once | INTRAVENOUS | Status: AC
  Administered 2012-12-11: 40 mg via INTRAVENOUS
  Filled 2012-12-11: qty 40

## 2012-12-11 MED ORDER — PANTOPRAZOLE SODIUM 40 MG IV SOLR
40.0000 mg | INTRAVENOUS | Status: DC
  Administered 2012-12-12 – 2012-12-13 (×2): 40 mg via INTRAVENOUS
  Filled 2012-12-11 (×2): qty 40

## 2012-12-11 MED ORDER — KETOROLAC TROMETHAMINE 30 MG/ML IJ SOLN
15.0000 mg | Freq: Four times a day (QID) | INTRAMUSCULAR | Status: DC | PRN
  Filled 2012-12-11: qty 1

## 2012-12-11 MED ORDER — POTASSIUM CHLORIDE 10 MEQ/100ML IV SOLN
10.0000 meq | Freq: Once | INTRAVENOUS | Status: AC
  Administered 2012-12-11: 10 meq via INTRAVENOUS
  Filled 2012-12-11: qty 100

## 2012-12-11 MED ORDER — KETOROLAC TROMETHAMINE 30 MG/ML IJ SOLN
15.0000 mg | Freq: Four times a day (QID) | INTRAMUSCULAR | Status: DC | PRN
  Administered 2012-12-12 – 2012-12-13 (×3): 15 mg via INTRAVENOUS
  Filled 2012-12-11 (×3): qty 1

## 2012-12-11 MED ORDER — POTASSIUM CHLORIDE IN NACL 40-0.9 MEQ/L-% IV SOLN
INTRAVENOUS | Status: DC
  Administered 2012-12-11: 125 mL/h via INTRAVENOUS
  Filled 2012-12-11 (×4): qty 1000

## 2012-12-11 MED ORDER — SODIUM CHLORIDE 0.9 % IV BOLUS (SEPSIS)
1000.0000 mL | Freq: Once | INTRAVENOUS | Status: AC
  Administered 2012-12-11: 1000 mL via INTRAVENOUS

## 2012-12-11 MED ORDER — ONDANSETRON HCL 4 MG PO TABS: 4.0000 mg | ORAL_TABLET | Freq: Four times a day (QID) | ORAL | Status: DC

## 2012-12-11 MED ORDER — IOHEXOL 300 MG/ML  SOLN
50.0000 mL | Freq: Once | INTRAMUSCULAR | Status: AC | PRN
  Administered 2012-12-11: 50 mL via ORAL

## 2012-12-11 MED ORDER — IOHEXOL 300 MG/ML  SOLN
100.0000 mL | Freq: Once | INTRAMUSCULAR | Status: AC | PRN
  Administered 2012-12-11: 100 mL via INTRAVENOUS

## 2012-12-11 MED ORDER — PROMETHAZINE HCL 25 MG/ML IJ SOLN
12.5000 mg | Freq: Four times a day (QID) | INTRAMUSCULAR | Status: DC | PRN
  Administered 2012-12-11 – 2012-12-14 (×3): 12.5 mg via INTRAVENOUS
  Filled 2012-12-11 (×3): qty 1

## 2012-12-11 MED ORDER — HYDROMORPHONE HCL PF 1 MG/ML IJ SOLN
1.0000 mg | INTRAMUSCULAR | Status: DC | PRN
  Administered 2012-12-11: 1 mg via INTRAVENOUS
  Filled 2012-12-11: qty 1

## 2012-12-11 MED ORDER — DEXTROSE 5 % IV SOLN
1.0000 g | INTRAVENOUS | Status: DC
  Administered 2012-12-12 – 2012-12-13 (×2): 1 g via INTRAVENOUS
  Filled 2012-12-11 (×3): qty 10

## 2012-12-11 NOTE — ED Notes (Signed)
Seen here on the 20th and dx with dehydration, Vomiting, diarrhea, and uti. Pt states she is no better. Pt states discomfort to chest when taking a deep breath. Vomiting x 6 days. Unable to keep meds down.

## 2012-12-11 NOTE — H&P (Signed)
[facility] History and Physical  Tonya Stewart ZOX:[id number] DOB: 04/26/1981 DOA: 12/11/2012  Referring physician: ED physician, Dr. Aileen Stewart PCP: Tonya Ribas, MD  Specialists:   Chief Complaint: Nausea, vomiting, abdominal pain.  HPI: Tonya Stewart is a 32 y.o. female with no significant past medical history, who presents to the emergency department with a 6 day history of persistent nausea and vomiting. Over the past few days, she has vomited more than 20 times. She initially presented to the emergency department on 12/08/2012 for nausea, vomiting, and abdominal pain. She was found to have a urinary tract infection. She was discharged on Macrodantin. Since that time, she is taking very little Macrodantin because of nausea and vomiting. She had one episode of coffee grounds emesis, but the subsequent episodes revealed just clear fluid. She has had associated suprapubic abdominal pain and bilateral flank pain, greater on the right. Her pain is described as crampy and intermittently sharp. It is mostly 10 over 10 in intensity. Nothing makes the pain worse or better. She had one loose stool yesterday, otherwise she denies diarrhea. She has regular menstrual periods. She is sexually active and does not use barrier protection. She is status post tubal ligation. She denies vaginal discharge or history of STDs.  In the emergency department, she is afebrile and hemodynamically stable. Her lab data are significant for a WBC of 11.7, hemoglobin of 16.2, potassium of 2.7. CT of her abdomen and pelvis revealed no acute abnormalities. The urine culture from 12/08/2012 reveals greater than 100,000 colonies of Escherichia coli, intermediate to nitrofurantoin. She is being admitted for further evaluation and management.   Review of Systems: As above in history present illness, otherwise negative.   History reviewed. No pertinent past medical history. Past Surgical History  Procedure Laterality  Date  . Tubal ligation     Social History: She is divorced. She lives in [place]. She has 3 children. She is unemployed. She smokes a pack of cigarettes per day. She smokes marijuana on occasion. She denies alcohol use.  Allergies  Allergen Reactions  . Flagyl (Metronidazole)     hives  . Hydrocodone Itching and Nausea And Vomiting   Family history: Both of her parents are alive and they have no significant medical history.  Prior to Admission medications   Medication Sig Start Date End Date Taking? Authorizing Provider  diphenoxylate-atropine (LOMOTIL) 2.5-0.025 MG per tablet Take 1 tablet by mouth 4 (four) times daily as needed for diarrhea or loose stools. 12/08/12  Yes Tonya Crease, MD  nitrofurantoin, macrocrystal-monohydrate, (MACROBID) 100 MG capsule Take 100 mg by mouth 2 (two) times daily. X 7 days 12/08/12  Yes Tonya Crease, MD  ondansetron (ZOFRAN) 4 MG tablet Take 4 mg by mouth every 6 (six) hours as needed for nausea. 12/08/12  Yes Tonya Crease, MD  oxyCODONE-acetaminophen (PERCOCET/ROXICET) 5-325 MG per tablet Take 1 tablet by mouth every 4 (four) hours as needed for pain.   Yes Historical Provider, MD   Physical Exam: Filed Vitals:   12/11/12 1152 12/11/12 1542  BP: 116/71 119/61  Pulse: 83 76  Temp: 97.2 F (36.2 C)   TempSrc: Oral   Resp: 16 16  Height: 5\' 4"  (1.626 m)   Weight: 81.647 kg (180 lb)   SpO2: 100%      General:  32 year old Caucasian woman lying in bed, who appears ill.  Eyes: Pupils are equal, round, and reactive to light. Extraocular movements are intact. Conjunctivae are clear. Sclerae are  white.  ENT: Oropharynx reveals mildly dry mucous membranes. She has a pierced ring in her tongue. No surrounding erythema. No posterior exudates.  Neck: Supple, no adenopathy, no thyromegaly, no JVD.  Cardiovascular: S1, S2, with a soft systolic murmur.  Respiratory: Clear to auscultation bilaterally.  Abdomen: Positive  bowel sounds, very soft, mildly obese, diffusely tender moderately. Mild bilateral flank tenderness.  Skin: Good turgor.  Musculoskeletal: No acute hot red joints. No pedal edema. Pedal pulses palpable.  Psychiatric: She appears to be in pain with grimacing with abdominal exam which seems to be exaggerated. Her speech is clear.  Neurologic: Cranial nerves II through XII are intact. Strength is 5 over 5 throughout. Sensation is intact.  Labs on Admission:  Basic Metabolic Panel:  Recent Labs Lab 12/08/12 1136 12/11/12 1212  NA 140 139  K 2.8* 2.7*  CL 96 93*  CO2 32 32  GLUCOSE 104* 101*  BUN 12 9  CREATININE 0.73 0.86  CALCIUM 9.9 9.8  MG  --  2.3   Liver Function Tests:  Recent Labs Lab 12/08/12 1136 12/11/12 1212  AST 14 13  ALT 11 14  ALKPHOS 58 61  BILITOT 0.3 0.5  PROT 7.3 7.5  ALBUMIN 4.3 4.5   No results found for this basename: LIPASE, AMYLASE,  in the last 168 hours No results found for this basename: AMMONIA,  in the last 168 hours CBC:  Recent Labs Lab 12/08/12 1136 12/11/12 1212  WBC 13.4* 11.7*  NEUTROABS 10.1* 9.4*  HGB 13.8 16.2*  HCT 39.4 44.2  MCV 95.6 91.5  PLT 275 327   Cardiac Enzymes: No results found for this basename: CKTOTAL, CKMB, CKMBINDEX, TROPONINI,  in the last 168 hours  BNP (last 3 results) No results found for this basename: PROBNP,  in the last 8760 hours CBG: No results found for this basename: GLUCAP,  in the last 168 hours  Radiological Exams on Admission: Ct Abdomen Pelvis W Contrast  12/11/2012   *RADIOLOGY REPORT*  Clinical Data: Abdominal pain with nausea and fever.  CT ABDOMEN AND PELVIS WITH CONTRAST  Technique:  Multidetector CT imaging of the abdomen and pelvis was performed following the standard protocol during bolus administration of intravenous contrast.  Contrast: OMNIPAQUE IOHEXOL 300 MG/ML  SOLN  Comparison: 12/22/2010.  Findings: The liver, spleen, pancreas, adrenal glands, kidneys, and biliary  tree are normal except for a small focal area of fatty infiltration in the left lobe of the liver adjacent to the falciform ligament, unchanged.  This is of no clinical significance.  The bowel is normal including the terminal ileum and appendix. Uterus and ovaries are normal.  No free air or free fluid.  No osseous abnormality.  IMPRESSION: Benign-appearing abdomen and pelvis.   Original Report Authenticated By: Francene Boyers, M.D.    EKG:   Assessment/Plan Principal Problem:   Nausea and vomiting Active Problems:   Acute pyelonephritis   E. coli UTI   Hypokalemia   Abdominal pain, generalized   Dehydration   78. 32 year old woman who presents with ongoing nausea, vomiting, suprapubic abdominal pain, and flank pain. She has failed outpatient treatment. Her urine culture from 5/20 reveals Escherichia coli. This histology is consistent with acute pyelonephritis, although the CT scan of her abdomen and pelvis is unremarkable. Her abdominal exam yields possibly exaggerated or possibly embellished tenderness. Her abdomen is completely soft. She has generalized dehydration as evident by her elevated hemoglobin. Her hypokalemia is  secondary to vomiting.     Plan:  1. The patient received Rocephin in the emergency department. We'll continue Rocephin. 2. She received Zofran for nausea. We'll get her scheduled Zofran IV every 6 hours and when necessary IV Phenergan. 3. Protonix was given in the emergency department. We'll continue with daily. 4. We'll allow sips and ice chips, but hold on diet. 5. Supportive treatment with analgesics. 6. The patient received one IV potassium runs. Will at potassium to maintenance IV fluids. We'll hydrate aggressively. 7. We'll check a blood magnesium level to rule out deficiency.     Code Status: Full code Family Communication: No family available Disposition Plan: Anticipate discharge to home in 48-72 hours.  Time spent: One  hour.  [facility] Pager [phone]  If 7PM-7AM, please contact night-coverage [email or web address] Password [facility] 12/11/2012, 3:51 PM

## 2012-12-11 NOTE — ED Notes (Signed)
CRITICAL VALUE ALERT  Critical value received: 2.7 potassium  Date of notification:  12/11/12  Time of notification:  1325  Critical value read back: yes  Nurse who received alert: j.  Bing Quarry  MD notified (1st page):  zammit  Time of first page:  1326  MD notified (2nd page):  Time of second page:  Responding MD:  zammit  Time MD responded: 1326

## 2012-12-11 NOTE — ED Provider Notes (Signed)
History  This chart was scribed for Benny Lennert, MD by Ardelia Mems, ED Scribe. This patient was seen in room APA07/APA07 and the patient's care was started at 12:05 PM.   CSN: [id number]  Arrival date & time 12/11/12  1143   Chief Complaint  Patient presents with  . Emesis  . Abdominal Pain    Patient is a 32 y.o. female presenting with vomiting and abdominal pain. The history is provided by the patient. No language interpreter was used.  Emesis Severity:  Moderate Duration:  5 days Timing:  Intermittent Progression:  Worsening Chronicity:  New Associated symptoms: no abdominal pain, no diarrhea and no headaches   Abdominal Pain This is a new problem. The current episode started more than 2 days ago. The problem occurs constantly. The problem has been gradually worsening. Pertinent negatives include no chest pain, no abdominal pain and no headaches.    HPI Comments: Tonya Stewart is a 32 y.o. female who presents to the Emergency Department complaining of gradual onset, gradually worsening, constant, moderate emesis and abdominal pain that began 5 days ago. Pt states that she has had multiple episodes of vomiting per day and has been unable to eat or sleep. Pt also reports constant, moderate chest pain that is worsened with inspiration. Pt reports associated fever, chills and abdominal cramps. Pt was seen here on 12/08/12 and diagnosed with dehydration, vomiting, diarrhea and UTI. She states that she is unable to keep her meds down and is not feeling better. Pt denies HA, confusion, back pain or any other symptoms. Pt denies alcohol use and is a current every day smoker.  History reviewed. No pertinent past medical history.  Past Surgical History  Procedure Laterality Date  . Tubal ligation      No family history on file.  History  Substance Use Topics  . Smoking status: Current Every Day Smoker  . Smokeless tobacco: Not on file  . Alcohol Use: No    OB History    Grav Para Term Preterm Abortions TAB SAB Ect Mult Living                  Review of Systems  Constitutional: Negative for appetite change and fatigue.  HENT: Negative for congestion, sinus pressure and ear discharge.   Eyes: Negative for discharge.  Respiratory: Negative for cough.   Cardiovascular: Negative for chest pain.  Gastrointestinal: Positive for vomiting. Negative for abdominal pain and diarrhea.  Genitourinary: Negative for frequency and hematuria.  Musculoskeletal: Negative for back pain.  Skin: Negative for rash.  Neurological: Negative for seizures and headaches.  Psychiatric/Behavioral: Negative for hallucinations.    Allergies  Flagyl and Hydrocodone  Home Medications   Current Outpatient Rx  Name  Route  Sig  Dispense  Refill  . acetaminophen (TYLENOL ARTHRITIS PAIN) 650 MG CR tablet   Oral   Take 1,300 mg by mouth every 8 (eight) hours as needed for pain.         . diphenoxylate-atropine (LOMOTIL) 2.5-0.025 MG per tablet   Oral   Take 1 tablet by mouth 4 (four) times daily as needed for diarrhea or loose stools.   30 tablet   0   . nitrofurantoin, macrocrystal-monohydrate, (MACROBID) 100 MG capsule   Oral   Take 1 capsule (100 mg total) by mouth 2 (two) times daily. X 7 days   14 capsule   0   . ondansetron (ZOFRAN) 4 MG tablet   Oral   Take 1  tablet (4 mg total) by mouth every 6 (six) hours.   12 tablet   0   . ondansetron (ZOFRAN-ODT) 4 MG disintegrating tablet   Oral   Take 4 mg by mouth once. Administered at [facility]         . oxyCODONE (OXY IR/ROXICODONE) 5 MG immediate release tablet   Oral   Take 5 mg by mouth once.           Triage Vitals:BP 116/71  Pulse 83  Temp(Src) 97.2 F (36.2 C) (Oral)  Resp 16  Ht 5\' 4"  (1.626 m)  Wt 180 lb (81.647 kg)  BMI 30.88 kg/m2  SpO2 100%  LMP 12/07/2012  Physical Exam  Constitutional: She is oriented to person, place, and time. She appears well-developed.   HENT:  Head: Normocephalic.  Mucous membranes are dry.  Eyes: Conjunctivae and EOM are normal. No scleral icterus.  Neck: Neck supple. No thyromegaly present.  Cardiovascular: Normal rate and regular rhythm.  Exam reveals no gallop and no friction rub.   No murmur heard. Pulmonary/Chest: No stridor. She has no wheezes. She has no rales. She exhibits no tenderness.  Abdominal: She exhibits no distension. There is tenderness. There is no rebound.  Moderate tenderness throughout abdomen.  Musculoskeletal: Normal range of motion. She exhibits no edema.  Lymphadenopathy:    She has no cervical adenopathy.  Neurological: She is oriented to person, place, and time. Coordination normal.  Skin: Rash noted. No erythema.  Rash above lip.  Psychiatric: She has a normal mood and affect. Her behavior is normal.    ED Course  Procedures (including critical care time)  DIAGNOSTIC STUDIES: Oxygen Saturation is 100% on RA, normal by my interpretation.    COORDINATION OF CARE: 12:10 PM- Pt advised of plan for treatment and pt agrees.  Medications  sodium chloride 0.9 % bolus 1,000 mL (1,000 mLs Intravenous New Bag/Given 12/11/12 1233)  ondansetron (ZOFRAN) injection 4 mg (4 mg Intravenous Given 12/11/12 1233)  pantoprazole (PROTONIX) injection 40 mg (40 mg Intravenous Given 12/11/12 1234)      Labs Reviewed  CBC WITH DIFFERENTIAL  COMPREHENSIVE METABOLIC PANEL  PREGNANCY, URINE   No results found.   No diagnosis found.    MDM        The chart was scribed for me under my direct supervision.  I personally performed the history, physical, and medical decision making and all procedures in the evaluation of this patient.Benny Lennert, MD 12/11/12 1515

## 2012-12-12 DIAGNOSIS — R197 Diarrhea, unspecified: Secondary | ICD-10-CM | POA: Diagnosis present

## 2012-12-12 DIAGNOSIS — A419 Sepsis, unspecified organism: Secondary | ICD-10-CM | POA: Diagnosis present

## 2012-12-12 LAB — CBC
HCT: 33.9 % — ABNORMAL LOW (ref 36.0–46.0)
Hemoglobin: 11.9 g/dL — ABNORMAL LOW (ref 12.0–15.0)
MCH: 32.7 pg (ref 26.0–34.0)
MCHC: 35.1 g/dL (ref 30.0–36.0)

## 2012-12-12 LAB — COMPREHENSIVE METABOLIC PANEL
ALT: 9 U/L (ref 0–35)
AST: 8 U/L (ref 0–37)
Alkaline Phosphatase: 40 U/L (ref 39–117)
CO2: 26 mEq/L (ref 19–32)
Calcium: 7.7 mg/dL — ABNORMAL LOW (ref 8.4–10.5)
Chloride: 107 mEq/L (ref 96–112)
GFR calc non Af Amer: >90 mL/min (ref >90)
Potassium: 3.6 mEq/L (ref 3.5–5.1)
Sodium: 139 mEq/L (ref 135–145)

## 2012-12-12 LAB — LIPASE, BLOOD: Lipase: 22 U/L (ref 11–59)

## 2012-12-12 LAB — CLOSTRIDIUM DIFFICILE BY PCR: Toxigenic C. Difficile by PCR: NEGATIVE

## 2012-12-12 MED ORDER — SODIUM CHLORIDE 0.9 % IV BOLUS (SEPSIS)
500.0000 mL | Freq: Once | INTRAVENOUS | Status: AC
  Administered 2012-12-12: 500 mL via INTRAVENOUS

## 2012-12-12 MED ORDER — OXYCODONE HCL 5 MG PO TABS
5.0000 mg | ORAL_TABLET | ORAL | Status: DC | PRN
  Administered 2012-12-12 – 2012-12-14 (×10): 5 mg via ORAL
  Filled 2012-12-12 (×10): qty 1

## 2012-12-12 MED ORDER — BOOST / RESOURCE BREEZE PO LIQD
1.0000 | Freq: Three times a day (TID) | ORAL | Status: DC
  Administered 2012-12-12 – 2012-12-14 (×4): 1 via ORAL

## 2012-12-12 NOTE — Progress Notes (Signed)
Subjective: Events noted overnight for the patient's blood pressure falling into the 80s systolically. Treated appropriately with IV fluid boluses. Also reportedly, the patient has had diarrhea. This patient says that she is feeling a little bit better and would like to try clear liquids.  Objective: Vital signs in last 24 hours: Filed Vitals:   12/12/12 0213 12/12/12 0336 12/12/12 0604 12/12/12 0857  BP: 90/38 102/44 86/47 109/64  Pulse: 49 56 65 61  Temp:   98.2 F (36.8 C)   TempSrc:      Resp: 16 16 18    Height:      Weight:      SpO2:   99% 99%    Intake/Output Summary (Last 24 hours) at 12/12/12 1119 Last data filed at 12/12/12 0847  Gross per 24 hour  Intake 2481.67 ml  Output      8 ml  Net 2473.67 ml    Weight change:   Physical exam: General: 32 year old Caucasian woman laying in bed, in no acute distress. She appears ill. Lungs: Clear to auscultation bilaterally. Heart: S1, S2, with no murmurs rubs or gallops. Abdomen: Mildly obese, positive bowel sounds, very soft, mildly and diffusely tender without rebound guarding or distention. Extremities: No pedal edema. Neurologic: She is alert and oriented x3.  Lab Results: Basic Metabolic Panel:  Recent Labs  78/29/56 1212 12/12/12 0804  NA 139 139  K 2.7* 3.6  CL 93* 107  CO2 32 26  GLUCOSE 101* 96  BUN 9 6  CREATININE 0.86 0.72  CALCIUM 9.8 7.7*  MG 2.3  --    Liver Function Tests:  Recent Labs  12/11/12 1212 12/12/12 0804  AST 13 8  ALT 14 9  ALKPHOS 61 40  BILITOT 0.5 0.4  PROT 7.5 5.0*  ALBUMIN 4.5 2.8*    Recent Labs  12/12/12 0804  LIPASE 22   No results found for this basename: AMMONIA,  in the last 72 hours CBC:  Recent Labs  12/11/12 1212 12/11/12 2120 12/12/12 0804  WBC 11.7* 14.1* 8.1  NEUTROABS 9.4*  --   --   HGB 16.2* 13.3 11.9*  HCT 44.2 37.1 33.9*  MCV 91.5 91.4 93.1  PLT 327 281 225   Cardiac Enzymes: No results found for this basename: CKTOTAL, CKMB,  CKMBINDEX, TROPONINI,  in the last 72 hours BNP: No results found for this basename: PROBNP,  in the last 72 hours D-Dimer: No results found for this basename: DDIMER,  in the last 72 hours CBG: No results found for this basename: GLUCAP,  in the last 72 hours Hemoglobin A1C: No results found for this basename: HGBA1C,  in the last 72 hours Fasting Lipid Panel: No results found for this basename: CHOL, HDL, LDLCALC, TRIG, CHOLHDL, LDLDIRECT,  in the last 72 hours Thyroid Function Tests: No results found for this basename: TSH, T4TOTAL, FREET4, T3FREE, THYROIDAB,  in the last 72 hours Anemia Panel: No results found for this basename: VITAMINB12, FOLATE, FERRITIN, TIBC, IRON, RETICCTPCT,  in the last 72 hours Coagulation: No results found for this basename: LABPROT, INR,  in the last 72 hours Urine Drug Screen: Drugs of Abuse  No results found for this basename: labopia,  cocainscrnur,  labbenz,  amphetmu,  thcu,  labbarb    Alcohol Level: No results found for this basename: ETH,  in the last 72 hours Urinalysis: No results found for this basename: COLORURINE, APPERANCEUR, LABSPEC, PHURINE, GLUCOSEU, HGBUR, BILIRUBINUR, KETONESUR, PROTEINUR, UROBILINOGEN, NITRITE, LEUKOCYTESUR,  in the last 72  hours Misc. Labs:   Micro: Recent Results (from the past 240 hour(s))  URINE CULTURE     Status: None   Collection Time    12/08/12  1:10 PM      Result Value Range Status   Specimen Description URINE, CLEAN CATCH   Final   Special Requests NONE   Final   Culture  Setup Time 12/09/2012 01:25   Final   Colony Count >=100,000 COLONIES/ML   Final   Culture ESCHERICHIA COLI   Final   Report Status 12/10/2012 FINAL   Final   Organism ID, Bacteria ESCHERICHIA COLI   Final  CLOSTRIDIUM DIFFICILE BY PCR     Status: None   Collection Time    12/12/12  2:30 AM      Result Value Range Status   C difficile by pcr NEGATIVE  NEGATIVE Final    Studies/Results: Ct Abdomen Pelvis W  Contrast  12/11/2012   *RADIOLOGY REPORT*  Clinical Data: Abdominal pain with nausea and fever.  CT ABDOMEN AND PELVIS WITH CONTRAST  Technique:  Multidetector CT imaging of the abdomen and pelvis was performed following the standard protocol during bolus administration of intravenous contrast.  Contrast: OMNIPAQUE IOHEXOL 300 MG/ML  SOLN  Comparison: 12/22/2010.  Findings: The liver, spleen, pancreas, adrenal glands, kidneys, and biliary tree are normal except for a small focal area of fatty infiltration in the left lobe of the liver adjacent to the falciform ligament, unchanged.  This is of no clinical significance.  The bowel is normal including the terminal ileum and appendix. Uterus and ovaries are normal.  No free air or free fluid.  No osseous abnormality.  IMPRESSION: Benign-appearing abdomen and pelvis.   Original Report Authenticated By: Francene Boyers, M.D.    Medications:  Scheduled: . cefTRIAXone (ROCEPHIN)  IV  1 g Intravenous Q24H  . enoxaparin (LOVENOX) injection  40 mg Subcutaneous Q24H  . ondansetron (ZOFRAN) IV  4 mg Intravenous Q6H  . pantoprazole (PROTONIX) IV  40 mg Intravenous Q24H   Continuous: . 0.9 % NaCl with KCl 40 mEq / L 150 mL/hr at 12/12/12 1610   RUE:[id number], acetaminophen, HYDROmorphone (DILAUDID) injection, ketorolac, ketorolac, oxyCODONE, promethazine  Assessment: Principal Problem:   Nausea and vomiting Active Problems:   Acute pyelonephritis   E. coli UTI   Hypokalemia   Abdominal pain, generalized   Sepsis   Dehydration   Diarrhea  1. Acute pyelonephritis with sepsis secondary to Escherichia coli. Failed outpatient treatment with nitrofurantoin. Her white blood cell count has normalized. She is afebrile. Her lactic acid is within normal limits. We'll continue Rocephin.  Hypotension, likely secondary to sepsis and hypovolemia/dehydration. Continue aggressive IV fluids. Her blood pressure is better.  Nausea and vomiting with abdominal  pain. Likely secondary to #1. Continue supportive treatment and PPI IV.  Diarrhea. C. difficile PCR pending.  Hypokalemia. Secondary to GI symptomatology. Repleting in the IV fluids.  Mild bradycardia.  Plan:  1. Will advance her diet to clear liquids. 2. Continue supportive treatment. 3. We'll order blood cultures if she becomes febrile. 4. We'll check the results of C. difficile PCR pending.   LOS: 1 day   Nealie Mchatton 12/12/2012, 11:19 AM

## 2012-12-12 NOTE — Progress Notes (Signed)
INITIAL NUTRITION ASSESSMENT  DOCUMENTATION CODES Per approved criteria  -Obesity Unspecified   INTERVENTION: Resource Breeze po TID, each supplement provides 250 kcal and 9 grams of protein.  NUTRITION DIAGNOSIS: Inadequate oral food intake related to altered GI function as evidenced by ongoing n/v, diet just advanced to clears.   Goal: Pt will meet >/= 90% of estimated energy needs.   Monitor:  Diet advancement, PO intake, labs, wt changes, skin integrity, changes in status  Reason for Assessment: MST=2  32 y.o. female  Admitting Dx: Nausea and vomiting  ASSESSMENT: Chart reviewed. Pt unavailable at time of visit.  Pt admitted with pyelonephritis secondary to E. Coli. Pt has been n/v for the past 6 days. C-diff results pending.  Pt has just been upgraded to clear liquid diet; no PO intake data available at this time.   Height: Ht Readings from Last 1 Encounters:  12/11/12 5\' 4"  (1.626 m)    Weight: Wt Readings from Last 1 Encounters:  12/11/12 180 lb (81.647 kg)    Ideal Body Weight: 120#  % Ideal Body Weight: 150%  Wt Readings from Last 10 Encounters:  12/11/12 180 lb (81.647 kg)  12/08/12 180 lb (81.647 kg)    Usual Body Weight: 180#  % Usual Body Weight: 100%  BMI:  Body mass index is 30.88 kg/(m^2). Meets criteria for obesity, class I.   Estimated Nutritional Needs: Kcal: 1500-1600 daily Protein: 66-82 grams daily Fluid: 1.5-1.6 L daily  Skin: no skin issues noted  Diet Order: Clear Liquid  EDUCATION NEEDS: -Education not appropriate at this time   Intake/Output Summary (Last 24 hours) at 12/12/12 1245 Last data filed at 12/12/12 0847  Gross per 24 hour  Intake 2481.67 ml  Output      8 ml  Net 2473.67 ml    Last BM: 12/12/12   Labs:   Recent Labs Lab 12/08/12 1136 12/11/12 1212 12/12/12 0804  NA 140 139 139  K 2.8* 2.7* 3.6  CL 96 93* 107  CO2 32 32 26  BUN 12 9 6   CREATININE 0.73 0.86 0.72  CALCIUM 9.9 9.8 7.7*  MG  --   2.3  --   GLUCOSE 104* 101* 96    CBG (last 3)  No results found for this basename: GLUCAP,  in the last 72 hours  Scheduled Meds: . cefTRIAXone (ROCEPHIN)  IV  1 g Intravenous Q24H  . enoxaparin (LOVENOX) injection  40 mg Subcutaneous Q24H  . ondansetron (ZOFRAN) IV  4 mg Intravenous Q6H  . pantoprazole (PROTONIX) IV  40 mg Intravenous Q24H    Continuous Infusions: . 0.9 % NaCl with KCl 40 mEq / L 150 mL/hr at 12/12/12 1610    History reviewed. No pertinent past medical history.  Past Surgical History  Procedure Laterality Date  . Tubal ligation      Melody Haver, RD, LDN Pager: [phone]

## 2012-12-12 NOTE — Progress Notes (Signed)
Notified Dr. Sherrie Mustache of pts low BP of 92/44, BP has been low throughout the night and today, no new orders at this time. Sheryn Bison

## 2012-12-12 NOTE — Progress Notes (Signed)
Patient's BP has remained low throughout the night. Boluses given as per orders. Patient has reported significant pain. Dilaudid held d/t low BP. Toradol ordered and given with little if any relief.

## 2012-12-13 ENCOUNTER — Encounter (HOSPITAL_COMMUNITY): Payer: Self-pay | Admitting: Internal Medicine

## 2012-12-13 DIAGNOSIS — Z720 Tobacco use: Secondary | ICD-10-CM

## 2012-12-13 DIAGNOSIS — R1084 Generalized abdominal pain: Secondary | ICD-10-CM

## 2012-12-13 HISTORY — DX: Tobacco use: Z72.0

## 2012-12-13 LAB — CBC
MCH: 33.2 pg (ref 26.0–34.0)
MCV: 95.4 fL (ref 78.0–100.0)
Platelets: 256 10*3/uL (ref 150–400)
RDW: 12.3 % (ref 11.5–15.5)

## 2012-12-13 LAB — BASIC METABOLIC PANEL
CO2: 25 mEq/L (ref 19–32)
Calcium: 8 mg/dL — ABNORMAL LOW (ref 8.4–10.5)
Creatinine, Ser: 0.73 mg/dL (ref 0.50–1.10)
Glucose, Bld: 86 mg/dL (ref 70–99)

## 2012-12-13 MED ORDER — NICOTINE 21 MG/24HR TD PT24
21.0000 mg | MEDICATED_PATCH | Freq: Every day | TRANSDERMAL | Status: DC
  Administered 2012-12-13: 21 mg via TRANSDERMAL
  Filled 2012-12-13 (×2): qty 1

## 2012-12-13 MED ORDER — LOPERAMIDE HCL 2 MG PO CAPS
2.0000 mg | ORAL_CAPSULE | Freq: Three times a day (TID) | ORAL | Status: DC
  Administered 2012-12-13 – 2012-12-14 (×4): 2 mg via ORAL
  Filled 2012-12-13 (×4): qty 1

## 2012-12-13 MED ORDER — POTASSIUM CHLORIDE IN NACL 20-0.9 MEQ/L-% IV SOLN
INTRAVENOUS | Status: DC
  Administered 2012-12-13: 14:00:00 via INTRAVENOUS

## 2012-12-13 NOTE — Progress Notes (Signed)
Pt has slight, non pitting edema in left hand/wrist area. Dr. Sherrie Mustache requested that IV be removed and a new site attained because pt is not yet ready for po medications. I made 1 attempt and was successful, but pt later complained of some tightness and pain at the site. Earnstine Regal, RN made 1 unsuccessful attempt, as did Norva Karvonen, Charity fundraiser. Trenton Gammon, RN from [place] was on the floor and offered to look, she was able to gain a very good site in the right [facility]. Sheryn Bison

## 2012-12-13 NOTE — Progress Notes (Signed)
Subjective: She has less pain, less nausea, and less diarrhea.  Objective: Vital signs in last 24 hours: Filed Vitals:   12/12/12 1729 12/12/12 2259 12/13/12 0257 12/13/12 0701  BP: 93/42 99/43 107/55 106/52  Pulse:  67 56 74  Temp:  98 F (36.7 C) 98.7 F (37.1 C) 98 F (36.7 C)  TempSrc:  Oral Oral Oral  Resp:  18 20 20   Height:      Weight:      SpO2:  99% 99% 99%    Intake/Output Summary (Last 24 hours) at 12/13/12 1334 Last data filed at 12/13/12 0559  Gross per 24 hour  Intake   3420 ml  Output   2850 ml  Net    570 ml    Weight change:   Physical exam: General: 32 year old Caucasian woman laying in bed, in no acute distress. She appears less ill. Lungs: Clear to auscultation bilaterally. Heart: S1, S2, with no murmurs rubs or gallops. Abdomen: Mildly obese, positive bowel sounds, very minimal hypogastric tenderness without rebound guarding or distention. Extremities: No pedal edema. Neurologic: She is alert and oriented x3.  Lab Results: Basic Metabolic Panel:  Recent Labs  45/40/98 1212 12/12/12 0804 12/13/12 0722  NA 139 139 141  K 2.7* 3.6 4.7  CL 93* 107 112  CO2 32 26 25  GLUCOSE 101* 96 86  BUN 9 6 3*  CREATININE 0.86 0.72 0.73  CALCIUM 9.8 7.7* 8.0*  MG 2.3  --   --    Liver Function Tests:  Recent Labs  12/11/12 1212 12/12/12 0804  AST 13 8  ALT 14 9  ALKPHOS 61 40  BILITOT 0.5 0.4  PROT 7.5 5.0*  ALBUMIN 4.5 2.8*    Recent Labs  12/12/12 0804  LIPASE 22   No results found for this basename: AMMONIA,  in the last 72 hours CBC:  Recent Labs  12/11/12 1212  12/12/12 0804 12/13/12 0722  WBC 11.7*  < > 8.1 8.8  NEUTROABS 9.4*  --   --   --   HGB 16.2*  < > 11.9* 12.9  HCT 44.2  < > 33.9* 37.1  MCV 91.5  < > 93.1 95.4  PLT 327  < > 225 256  < > = values in this interval not displayed. Cardiac Enzymes: No results found for this basename: CKTOTAL, CKMB, CKMBINDEX, TROPONINI,  in the last 72 hours BNP: No results  found for this basename: PROBNP,  in the last 72 hours D-Dimer: No results found for this basename: DDIMER,  in the last 72 hours CBG: No results found for this basename: GLUCAP,  in the last 72 hours Hemoglobin A1C: No results found for this basename: HGBA1C,  in the last 72 hours Fasting Lipid Panel: No results found for this basename: CHOL, HDL, LDLCALC, TRIG, CHOLHDL, LDLDIRECT,  in the last 72 hours Thyroid Function Tests: No results found for this basename: TSH, T4TOTAL, FREET4, T3FREE, THYROIDAB,  in the last 72 hours Anemia Panel: No results found for this basename: VITAMINB12, FOLATE, FERRITIN, TIBC, IRON, RETICCTPCT,  in the last 72 hours Coagulation: No results found for this basename: LABPROT, INR,  in the last 72 hours Urine Drug Screen: Drugs of Abuse  No results found for this basename: labopia,  cocainscrnur,  labbenz,  amphetmu,  thcu,  labbarb    Alcohol Level: No results found for this basename: ETH,  in the last 72 hours Urinalysis: No results found for this basename: COLORURINE, APPERANCEUR, LABSPEC, PHURINE, GLUCOSEU,  HGBUR, BILIRUBINUR, KETONESUR, PROTEINUR, UROBILINOGEN, NITRITE, LEUKOCYTESUR,  in the last 72 hours Misc. Labs:   Micro: Recent Results (from the past 240 hour(s))  URINE CULTURE     Status: None   Collection Time    12/08/12  1:10 PM      Result Value Range Status   Specimen Description URINE, CLEAN CATCH   Final   Special Requests NONE   Final   Culture  Setup Time 12/09/2012 01:25   Final   Colony Count >=100,000 COLONIES/ML   Final   Culture ESCHERICHIA COLI   Final   Report Status 12/10/2012 FINAL   Final   Organism ID, Bacteria ESCHERICHIA COLI   Final  CLOSTRIDIUM DIFFICILE BY PCR     Status: None   Collection Time    12/12/12  2:30 AM      Result Value Range Status   C difficile by pcr NEGATIVE  NEGATIVE Final    Studies/Results: Ct Abdomen Pelvis W Contrast  12/11/2012   *RADIOLOGY REPORT*  Clinical Data: Abdominal pain  with nausea and fever.  CT ABDOMEN AND PELVIS WITH CONTRAST  Technique:  Multidetector CT imaging of the abdomen and pelvis was performed following the standard protocol during bolus administration of intravenous contrast.  Contrast: OMNIPAQUE IOHEXOL 300 MG/ML  SOLN  Comparison: 12/22/2010.  Findings: The liver, spleen, pancreas, adrenal glands, kidneys, and biliary tree are normal except for a small focal area of fatty infiltration in the left lobe of the liver adjacent to the falciform ligament, unchanged.  This is of no clinical significance.  The bowel is normal including the terminal ileum and appendix. Uterus and ovaries are normal.  No free air or free fluid.  No osseous abnormality.  IMPRESSION: Benign-appearing abdomen and pelvis.   Original Report Authenticated By: Francene Boyers, M.D.    Medications:  Scheduled: . cefTRIAXone (ROCEPHIN)  IV  1 g Intravenous Q24H  . enoxaparin (LOVENOX) injection  40 mg Subcutaneous Q24H  . feeding supplement  1 Container Oral TID BM  . loperamide  2 mg Oral TID  . nicotine  21 mg Transdermal Daily  . ondansetron (ZOFRAN) IV  4 mg Intravenous Q6H  . pantoprazole (PROTONIX) IV  40 mg Intravenous Q24H   Continuous: . 0.9 % NaCl with KCl 40 mEq / L 75 mL/hr (12/13/12 0745)   ZOX:[id number], acetaminophen, HYDROmorphone (DILAUDID) injection, ketorolac, oxyCODONE, promethazine  Assessment: Principal Problem:   Nausea and vomiting Active Problems:   Acute pyelonephritis   E. coli UTI   Hypokalemia   Abdominal pain, generalized   Sepsis   Dehydration   Diarrhea  1. Acute pyelonephritis with sepsis secondary to Escherichia coli. Failed outpatient treatment with nitrofurantoin. Her white blood cell count has normalized. She is afebrile. Her lactic acid is within normal limits. We'll continue Rocephin.  Hypotension, likely secondary to sepsis and hypovolemia/dehydration.  Her blood pressure is better. We'll decrease IV fluids.  Nausea and  vomiting, diarrhea with abdominal pain. Likely secondary to #1. Resolving. Continue supportive treatment and PPI.  Diarrhea. C. difficile PCR negative.  Hypokalemia. Secondary to GI symptomatology. Repleting in the IV fluids.  Mild bradycardia. TSH pending.  Tobacco abuse.  Plan:  1. Advance diet to low residue. 2. Change Protonix to oral route. 3. Decreased IV fluids which have been done. 4. Tobacco cessation counseling. We'll place a nicotine patch.   LOS: 2 days   Tonya Stewart 12/13/2012, 1:34 PM

## 2012-12-13 NOTE — Progress Notes (Signed)
Pt was having some general agitation and anxiety, largely related to pt wanting to leave the floor and smoke. I explained that she was not able to do that at this time, as I was going to start her IV ABX. I explained to her that she could leave AMA if she so desired, but that I do not recommend this. Pt then began to calm down, and agrees to stay. Sheryn Bison

## 2012-12-13 NOTE — Progress Notes (Signed)
Pt had 50cc of dark green liquid stool.

## 2012-12-13 NOTE — Progress Notes (Addendum)
1255: Pt complains that left arm, (where her original IV was located) is tight and states that it is her arm, not just her hand, which she states is new, heat packs applied to arm. Will monitor. 2pm: pt states that her arm is feeling better at this time. Dr. Sherrie Mustache made aware, order received to keep arm elevated and keep warm compresses on it. Sheryn Bison

## 2012-12-13 NOTE — Progress Notes (Signed)
Late entry for 12/12/12. Pt complained of swelling in her left hand. IV was flushed, blood return noted, and no pain at the site. Nursing to monitor site. Sheryn Bison

## 2012-12-14 ENCOUNTER — Encounter (HOSPITAL_COMMUNITY): Payer: Self-pay | Admitting: Internal Medicine

## 2012-12-14 LAB — BASIC METABOLIC PANEL
BUN: 4 mg/dL — ABNORMAL LOW (ref 6–23)
CO2: 27 mEq/L (ref 19–32)
Calcium: 8.2 mg/dL — ABNORMAL LOW (ref 8.4–10.5)
GFR calc non Af Amer: >90 mL/min (ref >90)
Glucose, Bld: 95 mg/dL (ref 70–99)
Sodium: 140 mEq/L (ref 135–145)

## 2012-12-14 MED ORDER — PROMETHAZINE HCL 12.5 MG PO TABS: 12.5000 mg | ORAL_TABLET | Freq: Four times a day (QID) | ORAL | Status: DC | PRN

## 2012-12-14 MED ORDER — CEFUROXIME AXETIL 500 MG PO TABS: 500.0000 mg | ORAL_TABLET | Freq: Two times a day (BID) | ORAL | Status: DC

## 2012-12-14 MED ORDER — OXYCODONE-ACETAMINOPHEN 5-325 MG PO TABS: 1.0000 | ORAL_TABLET | ORAL | Status: DC | PRN

## 2012-12-14 MED ORDER — NICOTINE 21 MG/24HR TD PT24: MEDICATED_PATCH | TRANSDERMAL | Status: DC

## 2012-12-14 MED ORDER — OMEPRAZOLE 20 MG PO CPDR: 20.0000 mg | DELAYED_RELEASE_CAPSULE | Freq: Every day | ORAL | Status: DC

## 2012-12-14 NOTE — Discharge Summary (Signed)
Physician Discharge Summary  Tonya Stewart ZOX:[id number] DOB: January 11, 1981 DOA: 12/11/2012  PCP: Colette Ribas, MD  Admit date: 12/11/2012 Discharge date: 12/14/2012  Time spent: Greater than 30 minutes  Recommendations for Outpatient Follow-up:  1.   Discharge Diagnoses:  1. Escherichia coli acute pyelonephritis. 2. Hypotension secondary to early sepsis and dehydration. 3. Nausea/vomiting and Diarrhea. Secondary to #1 versus superimposed acute gastroenteritis. C. difficile negative. 4. Tobacco abuse. The patient was advised to stop smoking. 5. Hypokalemia secondary to diarrhea. Repleted. 6. Mild left hand/arm pain secondary to IV catheter, possibly secondary to mild thrombophlebitis.  Discharge Condition: Improved.  Diet recommendation: Low residue.  Filed Weights   12/11/12 1152  Weight: 81.647 kg (180 lb)    History of present illness:   Tonya Stewart is a 32 y.o. female with no significant past medical history, who presented to the emergency department with a 6 day history of persistent nausea and vomiting. Over the past few days, she has vomited more than 20 times. She initially presented to the emergency department on 12/08/2012 for nausea, vomiting, and abdominal pain. She was found to have a urinary tract infection. She was discharged on Macrodantin. Since that time, she had been taking very little Macrodantin because of nausea and vomiting. She had one episode of coffee grounds emesis, but the subsequent episodes revealed just clear fluid. She had associated suprapubic abdominal pain and bilateral flank pain, greater on the right. Her pain was described as crampy and intermittently sharp. It was mostly 10 over 10 in intensity. Nothing made the pain worse or better. She had one loose stool, otherwise she denied diarrhea. She has regular menstrual periods. She is sexually active and does not use barrier protection. She is status post tubal ligation. She denied vaginal  discharge or history of STDs.  In the emergency department, she was afebrile and hemodynamically stable. Her lab data were significant for a WBC of 11.7, hemoglobin of 16.2, potassium of 2.7. CT of her abdomen and pelvis revealed no acute abnormalities. The urine culture from 12/08/2012 revealeed greater than 100,000 colonies of Escherichia coli, intermediate to nitrofurantoin. She was admitted for further evaluation and management.   Hospital Course:   The patient received Rocephin in the emergency department. This was continued. Macrodantin was discontinued. IV fluids for volume repletion was started. Symptomatic treatment was given with scheduled Zofran IV and when necessary IV Phenergan. Protonix was also given empirically IV. Her pain was treated with as needed IV analgesics. Her potassium was repleted in the IV fluids. A blood magnesium level was assessed and it was within normal limits. During the first 24 hours, her systolic blood pressure fell into the 80s. Lactic acid level was assessed and it was within normal limits at 1.1. This was thought to be secondary to light and depletion, IV opiates, and likely early sepsis. She was bolused IV fluids and continued on aggressive IV fluid hydration. Following hydration, she began to have more loose stools and diarrhea. None with black tarry this or bright red blood. A stool specimen was collected for C. difficile testing. It was negative for C. difficile. She was up to be started on when necessary Imodium. When her blood pressure improved and when her diarrhea slowed down, the IV fluids were tapered down. Her diet was advanced slowly. With advancement, she tolerated it.   She was noted to be mildly bradycardic. Her TSH was assessed and was within normal limits. She was asymptomatic. She developed some swelling in her  left hand and arm downstream from the IV. The IV was taken out and her pain and swelling was treated with warm compresses. The pain and  swelling resolved.  She continued to improve progressively. She had no nausea or vomiting at the time of discharge. Her diarrhea resolved. Her lower abdominal pain subsided, but did not completely resolve. Nevertheless, she was much more comfortable and clinically improved. She was discharged on 7 more days of Ceftin. She was also given a prescription for Percocet and Phenergan to be used as needed.  Procedures:  None.  Consultations:  None.  Discharge Exam: Filed Vitals:   12/13/12 0701 12/13/12 1400 12/13/12 2135 12/14/12 0525  BP: 106/52 103/67 147/75 93/61  Pulse: 74 79 70 55  Temp: 98 F (36.7 C) 98 F (36.7 C) 98.7 F (37.1 C) 97.6 F (36.4 C)  TempSrc: Oral Oral    Resp: 20 20 20 16   Height:      Weight:      SpO2: 99% 100% 99% 98%    General: Pleasant alert Caucasian woman sitting up in bed, in no acute distress. Cardiovascular: S1, S2, with borderline bradycardia. Respiratory: Clear to auscultation bilaterally. Abdomen: Positive bowel sounds, soft, mildly tender over the hypogastrium/suprapubic area without distention, guarding, or rebound.  Discharge Instructions  Discharge Orders   Future Orders Complete By Expires     Diet - low sodium heart healthy  As directed     Discharge instructions  As directed     Comments:      Take medications as prescribed. Try to stop smoking.    Increase activity slowly  As directed         Medication List    STOP taking these medications       nitrofurantoin (macrocrystal-monohydrate) 100 MG capsule  Commonly known as:  MACROBID      TAKE these medications       cefUROXime 500 MG tablet  Commonly known as:  CEFTIN  Take 1 tablet (500 mg total) by mouth 2 (two) times daily. Antibiotic to be taken for 7 more days.     diphenoxylate-atropine 2.5-0.025 MG per tablet  Commonly known as:  LOMOTIL  Take 1 tablet by mouth 4 (four) times daily as needed for diarrhea or loose stools.     nicotine 21 mg/24hr patch   Commonly known as:  NICODERM CQ - dosed in mg/24 hours  Use as directed on the label.     omeprazole 20 MG capsule  Commonly known as:  PRILOSEC  Take 1 capsule (20 mg total) by mouth daily.     ondansetron 4 MG tablet  Commonly known as:  ZOFRAN  Take 4 mg by mouth every 6 (six) hours as needed for nausea.     oxyCODONE-acetaminophen 5-325 MG per tablet  Commonly known as:  PERCOCET/ROXICET  Take 1 tablet by mouth every 4 (four) hours as needed for pain.     promethazine 12.5 MG tablet  Commonly known as:  PHENERGAN  Take 1 tablet (12.5 mg total) by mouth every 6 (six) hours as needed for nausea.       Allergies  Allergen Reactions  . Flagyl (Metronidazole)     hives  . Hydrocodone Itching and Nausea And Vomiting      The results of significant diagnostics from this hospitalization (including imaging, microbiology, ancillary and laboratory) are listed below for reference.    Significant Diagnostic Studies: Ct Abdomen Pelvis W Contrast  12/11/2012   *RADIOLOGY REPORT*  Clinical Data: Abdominal pain with nausea and fever.  CT ABDOMEN AND PELVIS WITH CONTRAST  Technique:  Multidetector CT imaging of the abdomen and pelvis was performed following the standard protocol during bolus administration of intravenous contrast.  Contrast: OMNIPAQUE IOHEXOL 300 MG/ML  SOLN  Comparison: 12/22/2010.  Findings: The liver, spleen, pancreas, adrenal glands, kidneys, and biliary tree are normal except for a small focal area of fatty infiltration in the left lobe of the liver adjacent to the falciform ligament, unchanged.  This is of no clinical significance.  The bowel is normal including the terminal ileum and appendix. Uterus and ovaries are normal.  No free air or free fluid.  No osseous abnormality.  IMPRESSION: Benign-appearing abdomen and pelvis.   Original Report Authenticated By: Francene Boyers, M.D.    Microbiology: Recent Results (from the past 240 hour(s))  URINE CULTURE      Status: None   Collection Time    12/08/12  1:10 PM      Result Value Range Status   Specimen Description URINE, CLEAN CATCH   Final   Special Requests NONE   Final   Culture  Setup Time 12/09/2012 01:25   Final   Colony Count >=100,000 COLONIES/ML   Final   Culture ESCHERICHIA COLI   Final   Report Status 12/10/2012 FINAL   Final   Organism ID, Bacteria ESCHERICHIA COLI   Final  CLOSTRIDIUM DIFFICILE BY PCR     Status: None   Collection Time    12/12/12  2:30 AM      Result Value Range Status   C difficile by pcr NEGATIVE  NEGATIVE Final     Labs: Basic Metabolic Panel:  Recent Labs Lab 12/08/12 1136 12/11/12 1212 12/12/12 0804 12/13/12 0722 12/14/12 0626  NA 140 139 139 141 140  K 2.8* 2.7* 3.6 4.7 4.3  CL 96 93* 107 112 108  CO2 32 32 26 25 27   GLUCOSE 104* 101* 96 86 95  BUN 12 9 6  3* 4*  CREATININE 0.73 0.86 0.72 0.73 0.81  CALCIUM 9.9 9.8 7.7* 8.0* 8.2*  MG  --  2.3  --   --   --    Liver Function Tests:  Recent Labs Lab 12/08/12 1136 12/11/12 1212 12/12/12 0804  AST 14 13 8   ALT 11 14 9   ALKPHOS 58 61 40  BILITOT 0.3 0.5 0.4  PROT 7.3 7.5 5.0*  ALBUMIN 4.3 4.5 2.8*    Recent Labs Lab 12/12/12 0804  LIPASE 22   No results found for this basename: AMMONIA,  in the last 168 hours CBC:  Recent Labs Lab 12/08/12 1136 12/11/12 1212 12/11/12 2120 12/12/12 0804 12/13/12 0722  WBC 13.4* 11.7* 14.1* 8.1 8.8  NEUTROABS 10.1* 9.4*  --   --   --   HGB 13.8 16.2* 13.3 11.9* 12.9  HCT 39.4 44.2 37.1 33.9* 37.1  MCV 95.6 91.5 91.4 93.1 95.4  PLT 275 327 281 225 256   Cardiac Enzymes: No results found for this basename: CKTOTAL, CKMB, CKMBINDEX, TROPONINI,  in the last 168 hours BNP: BNP (last 3 results) No results found for this basename: PROBNP,  in the last 8760 hours CBG: No results found for this basename: GLUCAP,  in the last 168 hours     Signed:  Therin Vetsch  [facility] 12/14/2012, 3:20 PM

## 2012-12-14 NOTE — Progress Notes (Signed)
D/c instructions reviewed with patient and family.  Verbalized understanding.  Pt dc'd to home with family. Schonewitz, Candelaria Stagers 12/14/2012

## 2013-05-10 ENCOUNTER — Encounter (HOSPITAL_COMMUNITY): Payer: Self-pay | Admitting: Emergency Medicine

## 2013-05-10 ENCOUNTER — Inpatient Hospital Stay (HOSPITAL_COMMUNITY)
Admission: EM | Admit: 2013-05-10 | Discharge: 2013-05-13 | DRG: 372 | Disposition: A | Discharge status: Home / Self Care | Payer: Medicaid Other | Attending: Internal Medicine | Admitting: Internal Medicine

## 2013-05-10 ENCOUNTER — Inpatient Hospital Stay (HOSPITAL_COMMUNITY): Payer: Medicaid Other

## 2013-05-10 DIAGNOSIS — F17200 Nicotine dependence, unspecified, uncomplicated: Secondary | ICD-10-CM | POA: Diagnosis present

## 2013-05-10 DIAGNOSIS — D72829 Elevated white blood cell count, unspecified: Secondary | ICD-10-CM | POA: Diagnosis present

## 2013-05-10 DIAGNOSIS — E872 Acidosis, unspecified: Secondary | ICD-10-CM

## 2013-05-10 DIAGNOSIS — K529 Noninfective gastroenteritis and colitis, unspecified: Secondary | ICD-10-CM

## 2013-05-10 DIAGNOSIS — E876 Hypokalemia: Secondary | ICD-10-CM

## 2013-05-10 DIAGNOSIS — R1084 Generalized abdominal pain: Secondary | ICD-10-CM | POA: Diagnosis present

## 2013-05-10 DIAGNOSIS — I498 Other specified cardiac arrhythmias: Secondary | ICD-10-CM | POA: Diagnosis present

## 2013-05-10 DIAGNOSIS — R197 Diarrhea, unspecified: Secondary | ICD-10-CM

## 2013-05-10 DIAGNOSIS — A0472 Enterocolitis due to Clostridium difficile, not specified as recurrent: Principal | ICD-10-CM

## 2013-05-10 DIAGNOSIS — Z881 Allergy status to other antibiotic agents status: Secondary | ICD-10-CM

## 2013-05-10 DIAGNOSIS — R001 Bradycardia, unspecified: Secondary | ICD-10-CM | POA: Diagnosis present

## 2013-05-10 DIAGNOSIS — Z720 Tobacco use: Secondary | ICD-10-CM | POA: Diagnosis present

## 2013-05-10 DIAGNOSIS — R112 Nausea with vomiting, unspecified: Secondary | ICD-10-CM

## 2013-05-10 DIAGNOSIS — Z87440 Personal history of urinary (tract) infections: Secondary | ICD-10-CM

## 2013-05-10 DIAGNOSIS — E860 Dehydration: Secondary | ICD-10-CM

## 2013-05-10 DIAGNOSIS — A419 Sepsis, unspecified organism: Secondary | ICD-10-CM

## 2013-05-10 DIAGNOSIS — N10 Acute tubulo-interstitial nephritis: Secondary | ICD-10-CM

## 2013-05-10 LAB — CBC WITH DIFFERENTIAL/PLATELET
Eosinophils Relative: 0 % (ref 0–5)
HCT: 40.5 % (ref 36.0–46.0)
Hemoglobin: 14.1 g/dL (ref 12.0–15.0)
Lymphocytes Relative: 6 % — ABNORMAL LOW (ref 12–46)
MCHC: 34.8 g/dL (ref 30.0–36.0)
MCV: 95.5 fL (ref 78.0–100.0)
Monocytes Absolute: 0.4 10*3/uL (ref 0.1–1.0)
Monocytes Relative: 3 % (ref 3–12)
Neutro Abs: 14.6 10*3/uL — ABNORMAL HIGH (ref 1.7–7.7)
WBC: 16.1 10*3/uL — ABNORMAL HIGH (ref 4.0–10.5)

## 2013-05-10 LAB — URINE MICROSCOPIC-ADD ON

## 2013-05-10 LAB — BASIC METABOLIC PANEL
BUN: 12 mg/dL (ref 6–23)
CO2: 24 mEq/L (ref 19–32)
Calcium: 10.6 mg/dL — ABNORMAL HIGH (ref 8.4–10.5)
Chloride: 101 mEq/L (ref 96–112)
Creatinine, Ser: 0.65 mg/dL (ref 0.50–1.10)

## 2013-05-10 LAB — URINALYSIS, ROUTINE W REFLEX MICROSCOPIC
Bilirubin Urine: NEGATIVE
Glucose, UA: NEGATIVE mg/dL
Hgb urine dipstick: NEGATIVE
Protein, ur: 30 mg/dL — AB
Urobilinogen, UA: 0.2 mg/dL (ref 0.0–1.0)

## 2013-05-10 LAB — PREGNANCY, URINE: Preg Test, Ur: NEGATIVE

## 2013-05-10 MED ORDER — ONDANSETRON HCL 4 MG/2ML IJ SOLN
INTRAMUSCULAR | Status: AC
  Administered 2013-05-10: 4 mg
  Filled 2013-05-10: qty 2

## 2013-05-10 MED ORDER — SODIUM CHLORIDE 0.9 % IV BOLUS (SEPSIS)
1000.0000 mL | Freq: Once | INTRAVENOUS | Status: AC
  Administered 2013-05-10: 1000 mL via INTRAVENOUS

## 2013-05-10 MED ORDER — ACETAMINOPHEN 325 MG PO TABS
650.0000 mg | ORAL_TABLET | Freq: Once | ORAL | Status: AC
  Administered 2013-05-10: 650 mg via ORAL
  Filled 2013-05-10: qty 2

## 2013-05-10 MED ORDER — DEXTROSE 5 % IV SOLN
1.0000 g | Freq: Once | INTRAVENOUS | Status: AC
  Administered 2013-05-10: 1 g via INTRAVENOUS
  Filled 2013-05-10: qty 10

## 2013-05-10 MED ORDER — ONDANSETRON HCL 4 MG/2ML IJ SOLN
4.0000 mg | Freq: Once | INTRAMUSCULAR | Status: AC
  Administered 2013-05-10: 4 mg via INTRAVENOUS
  Filled 2013-05-10: qty 2

## 2013-05-10 MED ORDER — MORPHINE SULFATE 4 MG/ML IJ SOLN
4.0000 mg | Freq: Once | INTRAMUSCULAR | Status: AC
  Administered 2013-05-10: 4 mg via INTRAVENOUS
  Filled 2013-05-10: qty 1

## 2013-05-10 MED ORDER — IOHEXOL 300 MG/ML  SOLN
50.0000 mL | Freq: Once | INTRAMUSCULAR | Status: AC | PRN
  Administered 2013-05-10: 50 mL via ORAL

## 2013-05-10 MED ORDER — ONDANSETRON HCL 4 MG/2ML IJ SOLN
4.0000 mg | Freq: Once | INTRAMUSCULAR | Status: AC
  Administered 2013-05-10: 4 mg via INTRAVENOUS

## 2013-05-10 MED ORDER — SODIUM CHLORIDE 0.9 % IV SOLN
1000.0000 mL | Freq: Once | INTRAVENOUS | Status: AC
  Administered 2013-05-10: 1000 mL via INTRAVENOUS

## 2013-05-10 MED ORDER — SODIUM CHLORIDE 0.9 % IV SOLN
1000.0000 mL | INTRAVENOUS | Status: DC
  Administered 2013-05-10 – 2013-05-11 (×2): 1000 mL via INTRAVENOUS

## 2013-05-10 MED ORDER — ONDANSETRON HCL 4 MG/2ML IJ SOLN
INTRAMUSCULAR | Status: AC
  Filled 2013-05-10: qty 2

## 2013-05-10 MED ORDER — MORPHINE SULFATE 2 MG/ML IJ SOLN
2.0000 mg | Freq: Once | INTRAMUSCULAR | Status: AC
  Administered 2013-05-10: 2 mg via INTRAVENOUS
  Filled 2013-05-10: qty 1

## 2013-05-10 MED ORDER — IOHEXOL 300 MG/ML  SOLN
100.0000 mL | Freq: Once | INTRAMUSCULAR | Status: AC | PRN
  Administered 2013-05-10: 100 mL via INTRAVENOUS

## 2013-05-10 NOTE — ED Notes (Signed)
Pt attempted to drink oral contrast and was unable to keep down

## 2013-05-10 NOTE — ED Provider Notes (Signed)
CSN: [id number]     Arrival date & time 05/10/13  1904 History   First MD Initiated Contact with Patient 05/10/13 1929     Chief Complaint  Patient presents with  . Emesis  . Abdominal Pain    Patient is a 33 y.o. female presenting with vomiting and abdominal pain. The history is provided by the patient.  Emesis Severity:  Severe Duration:  3 days Timing:  Intermittent Progression:  Worsening Chronicity:  New Relieved by:  Nothing Worsened by:  Nothing tried Associated symptoms: abdominal pain, chills and diarrhea   Associated symptoms: no fever   Abdominal Pain Associated symptoms: chills, diarrhea and vomiting   Associated symptoms: no vaginal bleeding and no vaginal discharge   pt reports vomiting and diarrhea It is mostly clear fluid in vomitus, no blood in stool No cp. No cough She reports abdominal pain She took someone else's percocet without relief (advised not to take other people's meds) No recent travel reported   Past Medical History  Diagnosis Date  . Tobacco abuse 12/13/2012  . E. coli UTI 12/11/2012  . Acute pyelonephritis 12/11/2012  . Hypotension 12/11/2012.    ?early sepsis/dehydration   Past Surgical History  Procedure Laterality Date  . Tubal ligation     History reviewed. No pertinent family history. History  Substance Use Topics  . Smoking status: Current Every Day Smoker -- 1.00 packs/day for 15 years  . Smokeless tobacco: Not on file  . Alcohol Use: No   OB History   Grav Para Term Preterm Abortions TAB SAB Ect Mult Living                 Review of Systems  Constitutional: Positive for chills.  Gastrointestinal: Positive for vomiting, abdominal pain and diarrhea.  Genitourinary: Negative for vaginal bleeding and vaginal discharge.  Neurological: Positive for weakness.  All other systems reviewed and are negative.    Allergies  Flagyl and Hydrocodone  Home Medications  No current outpatient prescriptions on file. BP 93/41   Pulse 63  Temp(Src) 98.3 F (36.8 C) (Axillary)  Resp 24  Ht 5\' 5"  (1.651 m)  Wt 180 lb (81.647 kg)  BMI 29.95 kg/m2  SpO2 100% Physical Exam CONSTITUTIONAL: Well developed/well nourished, pt is actively vomiting on arrival to room HEAD: Normocephalic/atraumatic EYES: EOMI/PERRL, no icterus.  Pupils dilated bilaterally ENMT: Mucous membranes dry NECK: supple no meningeal signs SPINE:entire spine nontender CV: S1/S2 noted, no murmurs/rubs/gallops noted LUNGS: Lungs are clear to auscultation bilaterally, no apparent distress ABDOMEN: soft, mild diffuse tenderness, no rebound or guarding GU:no cva tenderness NEURO: Pt is awake/alert, moves all extremitiesx4 EXTREMITIES: pulses normal, full ROM SKIN: warm, color normal PSYCH: anxious   ED Course  Procedures  CRITICAL CARE Performed by: Joya Gaskins Total critical care time: 31 Critical care time was exclusive of separately billable procedures and treating other patients. Critical care was necessary to treat or prevent imminent or life-threatening deterioration. Critical care was time spent personally by me on the following activities: development of treatment plan with patient and/or surrogate as well as nursing, discussions with consultants, evaluation of patient's response to treatment, examination of patient, obtaining history from patient or surrogate, ordering and performing treatments and interventions, ordering and review of laboratory studies, ordering and review of radiographic studies, pulse oximetry and re-evaluation of patient's condition.   8:10 PM Pt with intractable vomiting and also has diarrhea.  Her abdominal exam is benign However, elevated lactate (lactate =5).  Pt has been admitted previously  for vomiting/sepsis Will follow closely.  IV fluids have been ordered for patient 9:38 PM Initial urine studies negative, however pt already treated for sepsis Her SBP is ranging from 90s-100 Her abdomen is soft to  palpation I discussed case with dr Sharl Ma.  He requests CT imaging but will admit patient Pt stabilized in the ER   Labs Review Labs Reviewed  CBC WITH DIFFERENTIAL - Abnormal; Notable for the following:    WBC 16.1 (*)    Neutrophils Relative % 91 (*)    Neutro Abs 14.6 (*)    Lymphocytes Relative 6 (*)    All other components within normal limits  CG4 I-STAT (LACTIC ACID) - Abnormal; Notable for the following:    Lactic Acid, Venous 5.01 (*)    All other components within normal limits  URINALYSIS, ROUTINE W REFLEX MICROSCOPIC  PREGNANCY, URINE  BASIC METABOLIC PANEL  LIPASE, BLOOD   Imaging Review No results found.  EKG Interpretation   None       MDM  No diagnosis found. Nursing notes including past medical history and social history reviewed and considered in documentation Labs/vital reviewed and considered Previous records reviewed and considered - h/o pyelonephritis     Joya Gaskins, MD 05/10/13 2140

## 2013-05-10 NOTE — ED Notes (Signed)
MD at bedside. 

## 2013-05-10 NOTE — ED Notes (Signed)
Pt with c/o sharp abd pain with N/V/D

## 2013-05-11 DIAGNOSIS — R001 Bradycardia, unspecified: Secondary | ICD-10-CM | POA: Diagnosis present

## 2013-05-11 DIAGNOSIS — E872 Acidosis, unspecified: Secondary | ICD-10-CM | POA: Diagnosis present

## 2013-05-11 DIAGNOSIS — N10 Acute tubulo-interstitial nephritis: Secondary | ICD-10-CM

## 2013-05-11 DIAGNOSIS — R197 Diarrhea, unspecified: Secondary | ICD-10-CM

## 2013-05-11 DIAGNOSIS — E876 Hypokalemia: Secondary | ICD-10-CM

## 2013-05-11 DIAGNOSIS — K5289 Other specified noninfective gastroenteritis and colitis: Secondary | ICD-10-CM

## 2013-05-11 LAB — COMPREHENSIVE METABOLIC PANEL
ALT: 16 U/L (ref 0–35)
AST: 18 U/L (ref 0–37)
Albumin: 3.4 g/dL — ABNORMAL LOW (ref 3.5–5.2)
CO2: 24 mEq/L (ref 19–32)
Calcium: 8 mg/dL — ABNORMAL LOW (ref 8.4–10.5)
Creatinine, Ser: 0.56 mg/dL (ref 0.50–1.10)
GFR calc Af Amer: >90 mL/min (ref >90)
GFR calc non Af Amer: >90 mL/min (ref >90)
Glucose, Bld: 123 mg/dL — ABNORMAL HIGH (ref 70–99)
Potassium: 3.6 mEq/L (ref 3.5–5.1)
Sodium: 144 mEq/L (ref 135–145)
Total Bilirubin: 0.2 mg/dL — ABNORMAL LOW (ref 0.3–1.2)

## 2013-05-11 LAB — CBC
Hemoglobin: 11.6 g/dL — ABNORMAL LOW (ref 12.0–15.0)
MCH: 33.1 pg (ref 26.0–34.0)
MCV: 96.9 fL (ref 78.0–100.0)
Platelets: 218 10*3/uL (ref 150–400)
RDW: 12.7 % (ref 11.5–15.5)
WBC: 16.9 10*3/uL — ABNORMAL HIGH (ref 4.0–10.5)

## 2013-05-11 LAB — TSH: TSH: 0.218 u[IU]/mL — ABNORMAL LOW (ref 0.350–4.500)

## 2013-05-11 MED ORDER — ONDANSETRON HCL 4 MG/2ML IJ SOLN
4.0000 mg | Freq: Four times a day (QID) | INTRAMUSCULAR | Status: DC | PRN
  Administered 2013-05-11 – 2013-05-12 (×3): 4 mg via INTRAVENOUS
  Filled 2013-05-11 (×3): qty 2

## 2013-05-11 MED ORDER — PNEUMOCOCCAL VAC POLYVALENT 25 MCG/0.5ML IJ INJ
0.5000 mL | INJECTION | INTRAMUSCULAR | Status: AC
  Filled 2013-05-11: qty 0.5

## 2013-05-11 MED ORDER — ENOXAPARIN SODIUM 40 MG/0.4ML ~~LOC~~ SOLN
40.0000 mg | SUBCUTANEOUS | Status: DC
  Administered 2013-05-11 – 2013-05-13 (×3): 40 mg via SUBCUTANEOUS
  Filled 2013-05-11 (×3): qty 0.4

## 2013-05-11 MED ORDER — HYDROMORPHONE HCL PF 1 MG/ML IJ SOLN
1.0000 mg | INTRAMUSCULAR | Status: DC | PRN
  Administered 2013-05-11 – 2013-05-12 (×5): 1 mg via INTRAVENOUS

## 2013-05-11 MED ORDER — ONDANSETRON HCL 4 MG PO TABS: 4.0000 mg | ORAL_TABLET | Freq: Four times a day (QID) | ORAL | Status: DC | PRN

## 2013-05-11 MED ORDER — PROMETHAZINE HCL 25 MG/ML IJ SOLN
12.5000 mg | Freq: Four times a day (QID) | INTRAMUSCULAR | Status: DC | PRN
  Administered 2013-05-11 – 2013-05-13 (×8): 12.5 mg via INTRAVENOUS
  Filled 2013-05-11 (×8): qty 1

## 2013-05-11 MED ORDER — FAMOTIDINE IN NACL 20-0.9 MG/50ML-% IV SOLN
20.0000 mg | Freq: Two times a day (BID) | INTRAVENOUS | Status: DC
  Administered 2013-05-11 – 2013-05-13 (×5): 20 mg via INTRAVENOUS
  Filled 2013-05-11 (×7): qty 50

## 2013-05-11 MED ORDER — POTASSIUM CHLORIDE 10 MEQ/100ML IV SOLN
10.0000 meq | INTRAVENOUS | Status: AC
  Administered 2013-05-11 (×3): 10 meq via INTRAVENOUS
  Filled 2013-05-11 (×3): qty 100

## 2013-05-11 MED ORDER — HYDROMORPHONE HCL PF 1 MG/ML IJ SOLN
1.0000 mg | INTRAMUSCULAR | Status: DC | PRN
  Administered 2013-05-11 (×3): 1 mg via INTRAVENOUS
  Filled 2013-05-11 (×9): qty 1

## 2013-05-11 MED ORDER — NICOTINE 21 MG/24HR TD PT24
21.0000 mg | MEDICATED_PATCH | Freq: Every day | TRANSDERMAL | Status: DC
  Administered 2013-05-11 – 2013-05-13 (×3): 21 mg via TRANSDERMAL
  Filled 2013-05-11 (×3): qty 1

## 2013-05-11 MED ORDER — POTASSIUM CHLORIDE IN NACL 20-0.9 MEQ/L-% IV SOLN
INTRAVENOUS | Status: DC
  Administered 2013-05-11 – 2013-05-12 (×3): via INTRAVENOUS

## 2013-05-11 MED ORDER — HYDROMORPHONE HCL PF 1 MG/ML IJ SOLN
1.0000 mg | Freq: Once | INTRAMUSCULAR | Status: AC
  Administered 2013-05-11: 1 mg via INTRAVENOUS
  Filled 2013-05-11: qty 1

## 2013-05-11 NOTE — Plan of Care (Signed)
Problem: Consults Goal: General Medical Patient Education See Patient Education Module for specific education. Outcome: Progressing Pt was admitted for n/v/d and Abdominal pain which has improved since her arrival to ICU  Problem: Phase I Progression Outcomes Goal: Pain controlled with appropriate interventions Outcome: Progressing Pt has received  PRN dilaudid & phenergan which has promoted sleeping at present time Goal: Voiding-avoid urinary catheter unless indicated Outcome: Progressing Pt has voided via BSC  Goal: Hemodynamically stable Outcome: Progressing HR=76 resp=16 sat=95% on RA Bp=90/45

## 2013-05-11 NOTE — Progress Notes (Signed)
UR chart review completed.  

## 2013-05-11 NOTE — Progress Notes (Signed)
Pt advised and encouraged to stop smoking.  Care notes given and spoke with patient about health risks associated with smoking.  Pt verbalizes understanding but does not verbalize any wish to stop at this time.  Nicotine placed as ordered.

## 2013-05-11 NOTE — Progress Notes (Signed)
The patient is a 32 year old with a history of Escherichia coli pyelonephritis and tobacco abuse, who was admitted early this morning for abdominal pain, nausea, and vomiting. She also had short-lived diarrhea. She was briefly seen and examined. Her vital signs and laboratory studies were reviewed. Her urinalysis is not indicative of a urinary tract infection. C. difficile PCR and blood cultures have been ordered and are currently pending. Her lactic acid level has now normalized. Her potassium has been repleted, but will add potassium chloride to the IV fluids. Her anemia is dilutional. Her white blood cell count may be reactive or secondary to a possible acute gastroenteritis. Will add IV Pepcid empirically. We'll start clear liquid diet without advancement. We'll add a nicotine patch and order tobacco cessation counseling. Otherwise, continue supportive treatment. Agree with management otherwise.

## 2013-05-11 NOTE — Care Management Note (Addendum)
    Page 1 of 1   05/13/2013     3:17:12 PM   CARE MANAGEMENT NOTE 05/13/2013  Patient:  CLARIE, CAMEY   Account Number:  [id number]  Date Initiated:  05/11/2013  Documentation initiated by:  Sharrie Rothman  Subjective/Objective Assessment:   Pt admitted from home with probable viral gastroenteritis. Pt lives with her boyfriend and will return home at discharge. Pt is independent with ADL's.     Action/Plan:   No CM needs noted.   Anticipated DC Date:  05/12/2013   Anticipated DC Plan:  HOME/SELF CARE      DC Planning Services  CM consult      Choice offered to / List presented to:             Status of service:  Completed, signed off Medicare Important Message given?   (If response is "NO", the following Medicare IM given date fields will be blank) Date Medicare IM given:   Date Additional Medicare IM given:    Discharge Disposition:  HOME/SELF CARE  Per UR Regulation:    If discussed at Long Length of Stay Meetings, dates discussed:    Comments:  05/13/13 1515 Arlyss Queen, RN BSN CM Pt dsicharged today. No CM needs noted.  05/11/13 1400 Arlyss Queen, RN BSN CM

## 2013-05-11 NOTE — Progress Notes (Signed)
Nutrition Brief Note  Patient identified on the Malnutrition Screening Tool (MST) Report  Patient Active Problem List   Diagnosis Date Noted  . Bradycardia 05/11/2013  . Lactic acidosis 05/11/2013  . Tobacco abuse 12/13/2012  . Diarrhea 12/12/2012  . Sepsis 12/12/2012  . Nausea and vomiting 12/11/2012  . Acute pyelonephritis 12/11/2012  . E. coli UTI 12/11/2012  . Dehydration 12/11/2012  . Hypokalemia 12/11/2012  . Abdominal pain, generalized 12/11/2012   Sodium  Date/Time Value Range Status  05/11/2013  4:25 AM 144  135 - 145 mEq/L Final  05/10/2013  7:41 PM 144  135 - 145 mEq/L Final  12/14/2012  6:26 AM 140  135 - 145 mEq/L Final    Potassium  Date/Time Value Range Status  05/11/2013  4:25 AM 3.6  3.5 - 5.1 mEq/L Final  05/10/2013  7:41 PM 3.3* 3.5 - 5.1 mEq/L Final  12/14/2012  6:26 AM 4.3  3.5 - 5.1 mEq/L Final    No results found for this basename: phos    Magnesium  Date/Time Value Range Status  12/11/2012 12:12 PM 2.3  1.5 - 2.5 mg/dL Final    Wt Readings from Last 15 Encounters:  05/10/13 185 lb 13.6 oz (84.3 kg)  12/11/12 180 lb (81.647 kg)  12/08/12 180 lb (81.647 kg)  Weight without significant change.  Body mass index is 30.93 kg/(m^2). Patient meets criteria for Obesity Class I based on current BMI.  Current diet advanced to clear liquids. PT had nausea vomiting and diarrhea prior to admission. Afebrile. No emesis since admission noted. Smokes 2 packs daily. Labs and medications reviewed.    No nutrition interventions warranted at this time. If nutrition issues arise, please consult RD.   Royann Shivers MS,RD,LDN Office: [phone] Pager: [phone]

## 2013-05-11 NOTE — H&P (Addendum)
PCP:   Colette Ribas, MD   Chief Complaint:  Nausea vomiting and diarrhea   HPI: 32 year old female came to the ED with nausea vomiting and diarrhea which started about 3 days ago. Patient says that she checked a urine pregnancy test which was negative at home. Patient says that her tubes are tied. She has been having nausea vomiting and started every morning, there was no blood in the vomitus. She also had diarrhea and lower abdominal pain. She denies fever no chest pain or shortness of breath. Patient is a smoker and smokes 2 packs per day. In the ED she was found to have lactic acidosis with lactate of 5.1, CT abdomen pelvis was done in the ED which was negative for any acute abnormality. Patient had 7-8 loose bowel movements for last 3 days. She denies any history of fever.  Allergies:   Allergies  Allergen Reactions  . Flagyl [Metronidazole] Hives  . Hydrocodone Itching and Nausea And Vomiting      Past Medical History  Diagnosis Date  . Tobacco abuse 12/13/2012  . E. coli UTI 12/11/2012  . Acute pyelonephritis 12/11/2012  . Hypotension 12/11/2012.    ?early sepsis/dehydration    Past Surgical History  Procedure Laterality Date  . Tubal ligation      Prior to Admission medications   Not on File    Social History:  reports that she has been smoking.  She does not have any smokeless tobacco history on file. She reports that she does not drink alcohol or use illicit drugs.     All the positives are listed in BOLD  Review of Systems:  HEENT: Headache, blurred vision, runny nose, sore throat Neck: Hypothyroidism, hyperthyroidism,,lymphadenopathy Chest : Shortness of breath, history of COPD, Asthma Heart : Chest pain, history of coronary arterey disease GI:  Nausea, vomiting, diarrhea, constipation, GERD GU: Dysuria, urgency, frequency of urination, hematuria Neuro: Stroke, seizures, syncope Psych: Depression, anxiety, hallucinations   Physical Exam: Blood  pressure 113/57, pulse 51, temperature 98.6 F (37 C), temperature source Oral, resp. rate 20, height 5\' 5"  (1.651 m), weight 84.3 kg (185 lb 13.6 oz), SpO2 100.00%. Constitutional:   Patient is a well-developed and well-nourished female* in no acute distress and cooperative with exam. Head: Normocephalic and atraumatic Mouth: Mucus membranes moist Eyes: PERRL, EOMI, conjunctivae normal Neck: Supple, No Thyromegaly Cardiovascular: RRR, S1 normal, S2 normal Pulmonary/Chest: CTAB, no wheezes, rales, or rhonchi Abdominal: Soft. Positive tenderness to palpation in both the right and left lower quadrants, and suprapubic region , non-distended, bowel sounds are normal, no masses, organomegaly, or guarding present.  Neurological: A&O x3, Strenght is normal and symmetric bilaterally, cranial nerve II-XII are grossly intact, no focal motor deficit, sensory intact to light touch bilaterally.  Extremities : No Cyanosis, Clubbing or Edema   Labs on Admission:  Results for orders placed during the hospital encounter of 05/10/13 (from the past 48 hour(s))  CBC WITH DIFFERENTIAL     Status: Abnormal   Collection Time    05/10/13  7:41 PM      Result Value Range   WBC 16.1 (*) 4.0 - 10.5 K/uL   RBC 4.24  3.87 - 5.11 MIL/uL   Hemoglobin 14.1  12.0 - 15.0 g/dL   HCT 16.1  09.6 - 04.5 %   MCV 95.5  78.0 - 100.0 fL   MCH 33.3  26.0 - 34.0 pg   MCHC 34.8  30.0 - 36.0 g/dL   RDW 40.9  81.1 -  15.5 %   Platelets 286  150 - 400 K/uL   Neutrophils Relative % 91 (*) 43 - 77 %   Neutro Abs 14.6 (*) 1.7 - 7.7 K/uL   Lymphocytes Relative 6 (*) 12 - 46 %   Lymphs Abs 1.0  0.7 - 4.0 K/uL   Monocytes Relative 3  3 - 12 %   Monocytes Absolute 0.4  0.1 - 1.0 K/uL   Eosinophils Relative 0  0 - 5 %   Eosinophils Absolute 0.0  0.0 - 0.7 K/uL   Basophils Relative 0  0 - 1 %   Basophils Absolute 0.0  0.0 - 0.1 K/uL  BASIC METABOLIC PANEL     Status: Abnormal   Collection Time    05/10/13  7:41 PM      Result Value  Range   Sodium 144  135 - 145 mEq/L   Potassium 3.3 (*) 3.5 - 5.1 mEq/L   Chloride 101  96 - 112 mEq/L   CO2 24  19 - 32 mEq/L   Glucose, Bld 144 (*) 70 - 99 mg/dL   BUN 12  6 - 23 mg/dL   Creatinine, Ser 4.09  0.50 - 1.10 mg/dL   Calcium 81.1 (*) 8.4 - 10.5 mg/dL   GFR calc non Af Amer >90  >90 mL/min   GFR calc Af Amer >90  >90 mL/min   Comment: (NOTE)     The eGFR has been calculated using the CKD EPI equation.     This calculation has not been validated in all clinical situations.     eGFR's persistently <90 mL/min signify possible Chronic Kidney     Disease.  LIPASE, BLOOD     Status: None   Collection Time    05/10/13  7:41 PM      Result Value Range   Lipase 12  11 - 59 U/L  CG4 I-STAT (LACTIC ACID)     Status: Abnormal   Collection Time    05/10/13  7:49 PM      Result Value Range   Lactic Acid, Venous 5.01 (*) 0.5 - 2.2 mmol/L  URINALYSIS, ROUTINE W REFLEX MICROSCOPIC     Status: Abnormal   Collection Time    05/10/13  8:30 PM      Result Value Range   Color, Urine YELLOW  YELLOW   APPearance CLEAR  CLEAR   Specific Gravity, Urine 1.015  1.005 - 1.030   pH >9.0 (*) 5.0 - 8.0   Glucose, UA NEGATIVE  NEGATIVE mg/dL   Hgb urine dipstick NEGATIVE  NEGATIVE   Bilirubin Urine NEGATIVE  NEGATIVE   Ketones, ur >80 (*) NEGATIVE mg/dL   Protein, ur 30 (*) NEGATIVE mg/dL   Urobilinogen, UA 0.2  0.0 - 1.0 mg/dL   Nitrite NEGATIVE  NEGATIVE   Leukocytes, UA NEGATIVE  NEGATIVE  PREGNANCY, URINE     Status: None   Collection Time    05/10/13  8:30 PM      Result Value Range   Preg Test, Ur NEGATIVE  NEGATIVE   Comment:            THE SENSITIVITY OF THIS     METHODOLOGY IS >20 mIU/mL.  URINE MICROSCOPIC-ADD ON     Status: Abnormal   Collection Time    05/10/13  8:30 PM      Result Value Range   Squamous Epithelial / LPF FEW (*) RARE   WBC, UA 0-2  <3 WBC/hpf   RBC /  HPF 0-2  <3 RBC/hpf   Bacteria, UA RARE  RARE    Radiological Exams on Admission: Ct Abdomen  Pelvis W Contrast  05/10/2013   CLINICAL DATA:  Sharp abdominal pain and nausea, vomiting, diarrhea for 4 days. History of E coli pyelonephritis in May.  EXAM: CT ABDOMEN AND PELVIS WITH CONTRAST  TECHNIQUE: Multidetector CT imaging of the abdomen and pelvis was performed using the standard protocol following bolus administration of intravenous contrast.  CONTRAST:  50mL OMNIPAQUE IOHEXOL 300 MG/ML SOLN, OMNIPAQUE IOHEXOL 300 MG/ML SOLN  COMPARISON:  12/11/2012  FINDINGS: 3 mm nodule in the right lung base is stable since previous study. This is likely benign in a low risk patient.  The liver, spleen, gallbladder, pancreas, adrenal glands, kidneys, abdominal aorta, inferior vena cava, and retroperitoneal lymph nodes are unremarkable. Gastric wall is not thickened. Small bowel and colon are decompressed. The no free air or free fluid in the abdomen.  Pelvis: The uterus and ovaries are not enlarged. Bladder wall is not thickened. No free or loculated pelvic fluid collections. Appendix is normal. No evidence of diverticulitis. No significant pelvic lymphadenopathy. No destructive bone lesions appreciated.  IMPRESSION: No acute process demonstrated in the abdomen or pelvis. No significant change since previous study.   Electronically Signed   By: Burman Nieves M.D.   On: 05/10/2013 22:51    Assessment/Plan Active Problems:   Nausea and vomiting   Hypokalemia   Abdominal pain, generalized   Diarrhea  Gastroenteritis Patient appears to have viral gastroenteritis, CT abdomen pelvis is negative for any infectious pathology. Patient does have elevated white count with hypokalemia and elevated lactate, which all point towards hypovolemia causing tissue hypoperfusion. Patient has been given IV normal saline in the ED will continue with 125 per hour. Patient will be treated symptomatically with Phenergan and Dilaudid for pain. I will repeat lactic acid level at this time. Patient does not appear to be  septic. Also patient was concerned with tubal pregnancy as her tubes have been tied and she is sexually active. Urine pregnancy test is negative and CT abdomen pelvis also does not reveal any uterine or ovarian pathology. Patient recently had UTI and was treated with antibiotics for 2 weeks, will obtain stool for C. Difficile.   Code status: Presumed full code  Family discussion: Discussed with patient in detail   Time Spent on Admission: 60 min  Mihira Tozzi S [facility] Pager: [phone] 05/11/2013, 12:37 AM  If 7PM-7AM, please contact night-coverage  [email or web address]  Password [email or web address]

## 2013-05-12 DIAGNOSIS — E872 Acidosis: Secondary | ICD-10-CM

## 2013-05-12 DIAGNOSIS — R112 Nausea with vomiting, unspecified: Secondary | ICD-10-CM

## 2013-05-12 DIAGNOSIS — A0472 Enterocolitis due to Clostridium difficile, not specified as recurrent: Principal | ICD-10-CM

## 2013-05-12 LAB — COMPREHENSIVE METABOLIC PANEL
ALT: 29 U/L (ref 0–35)
AST: 24 U/L (ref 0–37)
Albumin: 3 g/dL — ABNORMAL LOW (ref 3.5–5.2)
Alkaline Phosphatase: 44 U/L (ref 39–117)
CO2: 27 mEq/L (ref 19–32)
Calcium: 8.6 mg/dL (ref 8.4–10.5)
Chloride: 112 mEq/L (ref 96–112)
GFR calc non Af Amer: >90 mL/min (ref >90)
Glucose, Bld: 96 mg/dL (ref 70–99)
Potassium: 4.1 mEq/L (ref 3.5–5.1)
Sodium: 144 mEq/L (ref 135–145)
Total Bilirubin: 0.2 mg/dL — ABNORMAL LOW (ref 0.3–1.2)

## 2013-05-12 LAB — CBC
Hemoglobin: 12.1 g/dL (ref 12.0–15.0)
MCH: 33 pg (ref 26.0–34.0)
MCHC: 33.2 g/dL (ref 30.0–36.0)
Platelets: 204 10*3/uL (ref 150–400)

## 2013-05-12 MED ORDER — VANCOMYCIN 50 MG/ML ORAL SOLUTION
ORAL | Status: AC
  Filled 2013-05-12: qty 15

## 2013-05-12 MED ORDER — DIPHENHYDRAMINE HCL 25 MG PO CAPS
25.0000 mg | ORAL_CAPSULE | Freq: Once | ORAL | Status: AC
  Administered 2013-05-12: 25 mg via ORAL
  Filled 2013-05-12: qty 1

## 2013-05-12 MED ORDER — OXYCODONE-ACETAMINOPHEN 5-325 MG PO TABS
2.0000 | ORAL_TABLET | Freq: Once | ORAL | Status: AC
  Administered 2013-05-12: 2 via ORAL
  Filled 2013-05-12: qty 2

## 2013-05-12 MED ORDER — VANCOMYCIN 50 MG/ML ORAL SOLUTION
125.0000 mg | Freq: Four times a day (QID) | ORAL | Status: DC
  Administered 2013-05-12 – 2013-05-13 (×4): 125 mg via ORAL
  Filled 2013-05-12 (×8): qty 2.5

## 2013-05-12 MED ORDER — METRONIDAZOLE 500 MG PO TABS: 500.0000 mg | ORAL_TABLET | Freq: Three times a day (TID) | ORAL | Status: DC

## 2013-05-12 NOTE — Progress Notes (Signed)
[facility] PROGRESS NOTE  Tonya Stewart ZOX:[id number] DOB: August 07, 1980 DOA: 05/10/2013 PCP: Colette Ribas, MD  Assessment/Plan: 1. C. difficile diarrhea. Patient admitted with significant diarrhea, leukocytosis. No bowel movement in last 48 hours. Patient did move her bowels today which tested positive for C. difficile. Since she has an allergy to Flagyl, she'll be started empirically on vancomycin. Clinically, she appears to be improving 2. Nausea and vomiting, clinically improved with Phenergan 3. Leukocytosis, resolved 4. Dehydration. Improved with IV fluids  Code Status: Full code Family Communication: Discussed with patient Disposition Plan: Discharge home, likely tomorrow   Consultants:  none  Procedures:  none  Antibiotics:  none  HPI/Subjective: Patient started to feel better. Has not had any vomiting today. She had a bowel movement after 48 hours.  Objective: Filed Vitals:   05/12/13 1833  BP: 106/57  Pulse: 57  Temp: 98.8 F (37.1 C)  Resp: 20    Intake/Output Summary (Last 24 hours) at 05/12/13 1946 Last data filed at 05/12/13 1700  Gross per 24 hour  Intake   1420 ml  Output      0 ml  Net   1420 ml   Filed Weights   05/10/13 1908 05/10/13 2342  Weight: 81.647 kg (180 lb) 84.3 kg (185 lb 13.6 oz)    Exam:   General:  No acute distress  Cardiovascular: S1, S2, regular rate and rhythm  Respiratory: Clear to auscultation bilaterally  Abdomen: Soft, mildly tender in the lower abdomen, positive bowel sounds  Musculoskeletal: No edema bilaterally   Data Reviewed: Basic Metabolic Panel:  Recent Labs Lab 05/10/13 1941 05/11/13 0425 05/12/13 0606  NA 144 144 144  K 3.3* 3.6 4.1  CL 101 109 112  CO2 24 24 27   GLUCOSE 144* 123* 96  BUN 12 9 6   CREATININE 0.65 0.56 0.71  CALCIUM 10.6* 8.0* 8.6   Liver Function Tests:  Recent Labs Lab 05/11/13 0425 05/12/13 0606  AST 18 24  ALT 16 29  ALKPHOS 46 44  BILITOT  0.2* 0.2*  PROT 5.6* 5.1*  ALBUMIN 3.4* 3.0*    Recent Labs Lab 05/10/13 1941  LIPASE 12   No results found for this basename: AMMONIA,  in the last 168 hours CBC:  Recent Labs Lab 05/10/13 1941 05/11/13 0425 05/12/13 0606  WBC 16.1* 16.9* 9.9  NEUTROABS 14.6*  --   --   HGB 14.1 11.6* 12.1  HCT 40.5 33.9* 36.4  MCV 95.5 96.9 99.2  PLT 286 218 204   Cardiac Enzymes: No results found for this basename: CKTOTAL, CKMB, CKMBINDEX, TROPONINI,  in the last 168 hours BNP (last 3 results) No results found for this basename: PROBNP,  in the last 8760 hours CBG: No results found for this basename: GLUCAP,  in the last 168 hours  Recent Results (from the past 240 hour(s))  CULTURE, BLOOD (ROUTINE X 2)     Status: None   Collection Time    05/10/13  9:10 PM      Result Value Range Status   Specimen Description BLOOD LEFT ANTECUBITAL   Final   Special Requests BOTTLES DRAWN AEROBIC AND ANAEROBIC 6CC   Final   Culture NO GROWTH 1 DAY   Final   Report Status PENDING   Incomplete  CULTURE, BLOOD (ROUTINE X 2)     Status: None   Collection Time    05/10/13  9:10 PM      Result Value Range Status   Specimen Description BLOOD  LEFT HAND   Final   Special Requests BOTTLES DRAWN AEROBIC AND ANAEROBIC 6CC   Final   Culture NO GROWTH 1 DAY   Final   Report Status PENDING   Incomplete  MRSA PCR SCREENING     Status: None   Collection Time    05/10/13 11:35 PM      Result Value Range Status   MRSA by PCR NEGATIVE  NEGATIVE Final   Comment:            The GeneXpert MRSA Assay (FDA     approved for NASAL specimens     only), is one component of a     comprehensive MRSA colonization     surveillance program. It is not     intended to diagnose MRSA     infection nor to guide or     monitor treatment for     MRSA infections.  CLOSTRIDIUM DIFFICILE BY PCR     Status: Abnormal   Collection Time    05/12/13  4:00 PM      Result Value Range Status   C difficile by pcr POSITIVE (*)  NEGATIVE Final   Comment: CRITICAL RESULT CALLED TO, READ BACK BY AND VERIFIED WITH:     FORTE,L. AT 1825 ON 05/12/2013 BY BAUGHAM,M.     Studies: Ct Abdomen Pelvis W Contrast  05/10/2013   CLINICAL DATA:  Sharp abdominal pain and nausea, vomiting, diarrhea for 4 days. History of E coli pyelonephritis in May.  EXAM: CT ABDOMEN AND PELVIS WITH CONTRAST  TECHNIQUE: Multidetector CT imaging of the abdomen and pelvis was performed using the standard protocol following bolus administration of intravenous contrast.  CONTRAST:  50mL OMNIPAQUE IOHEXOL 300 MG/ML SOLN, OMNIPAQUE IOHEXOL 300 MG/ML SOLN  COMPARISON:  12/11/2012  FINDINGS: 3 mm nodule in the right lung base is stable since previous study. This is likely benign in a low risk patient.  The liver, spleen, gallbladder, pancreas, adrenal glands, kidneys, abdominal aorta, inferior vena cava, and retroperitoneal lymph nodes are unremarkable. Gastric wall is not thickened. Small bowel and colon are decompressed. The no free air or free fluid in the abdomen.  Pelvis: The uterus and ovaries are not enlarged. Bladder wall is not thickened. No free or loculated pelvic fluid collections. Appendix is normal. No evidence of diverticulitis. No significant pelvic lymphadenopathy. No destructive bone lesions appreciated.  IMPRESSION: No acute process demonstrated in the abdomen or pelvis. No significant change since previous study.   Electronically Signed   By: Burman Nieves M.D.   On: 05/10/2013 22:51    Scheduled Meds: . enoxaparin (LOVENOX) injection  40 mg Subcutaneous Q24H  . famotidine (PEPCID) IV  20 mg Intravenous Q12H  . nicotine  21 mg Transdermal Daily  . pneumococcal 23 valent vaccine  0.5 mL Intramuscular Tomorrow-1000  . vancomycin  125 mg Oral Q6H   Continuous Infusions:   Principal Problem:   Nausea and vomiting Active Problems:   Hypokalemia   Abdominal pain, generalized   Diarrhea   Tobacco abuse   Bradycardia   Lactic  acidosis    Time spent:    [name]  [facility] Pager [phone]. If 7PM-7AM, please contact night-coverage at [email or web address], password [facility] 05/12/2013, 7:46 PM  LOS: 2 days

## 2013-05-12 NOTE — Progress Notes (Signed)
CRITICAL VALUE ALERT  Critical value received:  C.Diff  Date of notification:  05/12/2013   Time of notification:  06:25  Critical value read back:yes  Nurse who received alert:  Cyndia Diver, RN  MD notified (1st page):  Memon  Time of first page:  06:29  MD notified (2nd page):  Time of second page:  Responding MD: Kerry Hough  Time MD responded: 06:30

## 2013-05-13 DIAGNOSIS — E860 Dehydration: Secondary | ICD-10-CM

## 2013-05-13 LAB — BASIC METABOLIC PANEL
BUN: 6 mg/dL (ref 6–23)
Calcium: 8.6 mg/dL (ref 8.4–10.5)
GFR calc non Af Amer: >90 mL/min (ref >90)
Glucose, Bld: 90 mg/dL (ref 70–99)
Sodium: 142 mEq/L (ref 135–145)

## 2013-05-13 MED ORDER — OXYCODONE-ACETAMINOPHEN 5-325 MG PO TABS: 1.0000 | ORAL_TABLET | ORAL | Status: DC | PRN

## 2013-05-13 MED ORDER — DIPHENHYDRAMINE HCL 25 MG PO CAPS
25.0000 mg | ORAL_CAPSULE | ORAL | Status: DC | PRN
  Administered 2013-05-13: 25 mg via ORAL
  Filled 2013-05-13: qty 1

## 2013-05-13 MED ORDER — VANCOMYCIN 50 MG/ML ORAL SOLUTION: 125.0000 mg | Freq: Four times a day (QID) | ORAL | Status: DC

## 2013-05-13 MED ORDER — PROMETHAZINE HCL 25 MG PO TABS: 25.0000 mg | ORAL_TABLET | Freq: Four times a day (QID) | ORAL | Status: DC | PRN

## 2013-05-13 MED ORDER — OXYCODONE-ACETAMINOPHEN 5-325 MG PO TABS
1.0000 | ORAL_TABLET | ORAL | Status: DC | PRN
  Administered 2013-05-13 (×2): 1 via ORAL
  Filled 2013-05-13 (×2): qty 1

## 2013-05-13 NOTE — Discharge Summary (Signed)
Physician Discharge Summary  Tonya Stewart ZOX:[id number] DOB: 05-Jul-1981 DOA: 05/10/2013  PCP: Colette Ribas, MD  Admit date: 05/10/2013 Discharge date: 05/13/2013  Time spent: 35 minutes  Recommendations for Outpatient Follow-up:  1. Followup with primary care physician one to 2 weeks.  Discharge Diagnoses:  Principal Problem:   Enteritis due to Clostridium difficile Active Problems:   Nausea and vomiting   Hypokalemia   Abdominal pain, generalized   Diarrhea   Tobacco abuse   Bradycardia   Lactic acidosis   Discharge Condition: improved  Diet recommendation: low residue  [address] [facility] Weights   05/10/13 1908 05/10/13 2342  Weight: 81.647 kg (180 lb) 84.3 kg (185 lb 13.6 oz)    History of present illness:  32 year old female came to the ED with nausea vomiting and diarrhea which started about 3 days ago. Patient says that she checked a urine pregnancy test which was negative at home. Patient says that her tubes are tied. She has been having nausea vomiting and started every morning, there was no blood in the vomitus. She also had diarrhea and lower abdominal pain. She denies fever no chest pain or shortness of breath. Patient is a smoker and smokes 2 packs per day. In the ED she was found to have lactic acidosis with lactate of 5.1, CT abdomen pelvis was done in the ED which was negative for any acute abnormality. Patient had 7-8 loose bowel movements for last 3 days. She denies any history of fever.   Hospital Course:  This lady was admitted for vomiting, diarrhea, dehydration and lactic acidosis. She had frequent stools on admission. She was started on supportive therapy with IV fluids. Clostridium difficile PCR returned positive. She was started on oral vancomycin since she reported an allergy to Flagyl. Over her hospital course, patient clinically improved. She's no longer having diarrhea, her lab work has normalized and she is tolerating by mouth. The patient has  concerns regarding obtaining oral vancomycin, since Medicaid does not cover this medication. She was seen by case management who unfortunately could not give her any medication assistance since she already had Medicaid. She was advised to check with multiple pharmacies regarding the amount of co-pay she'll require. Clinically, the patient has stabilized, and no longer requires further inpatient treatment. She was discharged home.  Procedures:  none  Consultations:  none  Discharge Exam: Filed Vitals:   05/13/13 0554  BP: 104/71  Pulse: 56  Temp: 97.4 F (36.3 C)  Resp: 18    General: NAD Cardiovascular: S1, S2 RRR Respiratory: CTA B  Discharge Instructions  Discharge Orders   Future Orders Complete By Expires   Call MD for:  persistant nausea and vomiting  As directed    Call MD for:  temperature >100.4  As directed    Diet - low sodium heart healthy  As directed    Increase activity slowly  As directed        Medication List         oxyCODONE-acetaminophen 5-325 MG per tablet  Commonly known as:  PERCOCET/ROXICET  Take 1 tablet by mouth every 4 (four) hours as needed.     promethazine 25 MG tablet  Commonly known as:  PHENERGAN  Take 1 tablet (25 mg total) by mouth every 6 (six) hours as needed for nausea.     vancomycin 50 mg/mL oral solution  Commonly known as:  VANCOCIN  Take 2.5 mLs (125 mg total) by mouth every 6 (six) hours. For 14 days  Allergies  Allergen Reactions  . Flagyl [Metronidazole] Hives  . Hydrocodone Itching and Nausea And Vomiting      The results of significant diagnostics from this hospitalization (including imaging, microbiology, ancillary and laboratory) are listed below for reference.    Significant Diagnostic Studies: Ct Abdomen Pelvis W Contrast  05/10/2013   CLINICAL DATA:  Sharp abdominal pain and nausea, vomiting, diarrhea for 4 days. History of E coli pyelonephritis in May.  EXAM: CT ABDOMEN AND PELVIS WITH CONTRAST   TECHNIQUE: Multidetector CT imaging of the abdomen and pelvis was performed using the standard protocol following bolus administration of intravenous contrast.  CONTRAST:  50mL OMNIPAQUE IOHEXOL 300 MG/ML SOLN, OMNIPAQUE IOHEXOL 300 MG/ML SOLN  COMPARISON:  12/11/2012  FINDINGS: 3 mm nodule in the right lung base is stable since previous study. This is likely benign in a low risk patient.  The liver, spleen, gallbladder, pancreas, adrenal glands, kidneys, abdominal aorta, inferior vena cava, and retroperitoneal lymph nodes are unremarkable. Gastric wall is not thickened. Small bowel and colon are decompressed. The no free air or free fluid in the abdomen.  Pelvis: The uterus and ovaries are not enlarged. Bladder wall is not thickened. No free or loculated pelvic fluid collections. Appendix is normal. No evidence of diverticulitis. No significant pelvic lymphadenopathy. No destructive bone lesions appreciated.  IMPRESSION: No acute process demonstrated in the abdomen or pelvis. No significant change since previous study.   Electronically Signed   By: Burman Nieves M.D.   On: 05/10/2013 22:51    Microbiology: Recent Results (from the past 240 hour(s))  CULTURE, BLOOD (ROUTINE X 2)     Status: None   Collection Time    05/10/13  9:10 PM      Result Value Range Status   Specimen Description BLOOD LEFT ANTECUBITAL   Final   Special Requests BOTTLES DRAWN AEROBIC AND ANAEROBIC 6CC   Final   Culture NO GROWTH 3 DAYS   Final   Report Status PENDING   Incomplete  CULTURE, BLOOD (ROUTINE X 2)     Status: None   Collection Time    05/10/13  9:10 PM      Result Value Range Status   Specimen Description BLOOD LEFT HAND   Final   Special Requests BOTTLES DRAWN AEROBIC AND ANAEROBIC 6CC   Final   Culture NO GROWTH 3 DAYS   Final   Report Status PENDING   Incomplete  MRSA PCR SCREENING     Status: None   Collection Time    05/10/13 11:35 PM      Result Value Range Status   MRSA by PCR NEGATIVE   NEGATIVE Final   Comment:            The GeneXpert MRSA Assay (FDA     approved for NASAL specimens     only), is one component of a     comprehensive MRSA colonization     surveillance program. It is not     intended to diagnose MRSA     infection nor to guide or     monitor treatment for     MRSA infections.  CLOSTRIDIUM DIFFICILE BY PCR     Status: Abnormal   Collection Time    05/12/13  4:00 PM      Result Value Range Status   C difficile by pcr POSITIVE (*) NEGATIVE Final   Comment: CRITICAL RESULT CALLED TO, READ BACK BY AND VERIFIED WITH:     FORTE,L. AT  1825 ON 05/12/2013 BY BAUGHAM,M.     Labs: Basic Metabolic Panel:  Recent Labs Lab 05/10/13 1941 05/11/13 0425 05/12/13 0606 05/13/13 0512  NA 144 144 144 142  K 3.3* 3.6 4.1 3.6  CL 101 109 112 109  CO2 24 24 27 25   GLUCOSE 144* 123* 96 90  BUN 12 9 6 6   CREATININE 0.65 0.56 0.71 0.67  CALCIUM 10.6* 8.0* 8.6 8.6   Liver Function Tests:  Recent Labs Lab 05/11/13 0425 05/12/13 0606  AST 18 24  ALT 16 29  ALKPHOS 46 44  BILITOT 0.2* 0.2*  PROT 5.6* 5.1*  ALBUMIN 3.4* 3.0*    Recent Labs Lab 05/10/13 1941  LIPASE 12   No results found for this basename: AMMONIA,  in the last 168 hours CBC:  Recent Labs Lab 05/10/13 1941 05/11/13 0425 05/12/13 0606  WBC 16.1* 16.9* 9.9  NEUTROABS 14.6*  --   --   HGB 14.1 11.6* 12.1  HCT 40.5 33.9* 36.4  MCV 95.5 96.9 99.2  PLT 286 218 204   Cardiac Enzymes: No results found for this basename: CKTOTAL, CKMB, CKMBINDEX, TROPONINI,  in the last 168 hours BNP: BNP (last 3 results) No results found for this basename: PROBNP,  in the last 8760 hours CBG: No results found for this basename: GLUCAP,  in the last 168 hours     Signed:  Eldana Isip  [facility] 05/13/2013, 8:44 PM

## 2013-05-13 NOTE — Discharge Planning (Signed)
Pt stated that she was ready to be DC and pain was under control.  Pt's IV removed and DC paper given to her.  She was unformed of FU orders and given scripts.  Pt will be walked to car when ready.

## 2013-05-14 ENCOUNTER — Inpatient Hospital Stay (HOSPITAL_COMMUNITY)
Admission: EM | Admit: 2013-05-14 | Discharge: 2013-05-20 | DRG: 373 | Disposition: A | Discharge status: Home / Self Care | Payer: Medicaid Other | Attending: Internal Medicine | Admitting: Internal Medicine

## 2013-05-14 ENCOUNTER — Encounter (HOSPITAL_COMMUNITY): Payer: Self-pay | Admitting: Emergency Medicine

## 2013-05-14 ENCOUNTER — Observation Stay (HOSPITAL_COMMUNITY): Payer: Medicaid Other

## 2013-05-14 DIAGNOSIS — R911 Solitary pulmonary nodule: Secondary | ICD-10-CM | POA: Diagnosis present

## 2013-05-14 DIAGNOSIS — Z720 Tobacco use: Secondary | ICD-10-CM | POA: Diagnosis present

## 2013-05-14 DIAGNOSIS — R1084 Generalized abdominal pain: Secondary | ICD-10-CM

## 2013-05-14 DIAGNOSIS — A0472 Enterocolitis due to Clostridium difficile, not specified as recurrent: Principal | ICD-10-CM | POA: Diagnosis present

## 2013-05-14 DIAGNOSIS — E860 Dehydration: Secondary | ICD-10-CM | POA: Diagnosis present

## 2013-05-14 DIAGNOSIS — R7309 Other abnormal glucose: Secondary | ICD-10-CM | POA: Diagnosis present

## 2013-05-14 DIAGNOSIS — D72829 Elevated white blood cell count, unspecified: Secondary | ICD-10-CM | POA: Diagnosis present

## 2013-05-14 DIAGNOSIS — E876 Hypokalemia: Secondary | ICD-10-CM | POA: Diagnosis present

## 2013-05-14 DIAGNOSIS — R197 Diarrhea, unspecified: Secondary | ICD-10-CM

## 2013-05-14 DIAGNOSIS — F17200 Nicotine dependence, unspecified, uncomplicated: Secondary | ICD-10-CM | POA: Diagnosis present

## 2013-05-14 DIAGNOSIS — R112 Nausea with vomiting, unspecified: Secondary | ICD-10-CM | POA: Diagnosis present

## 2013-05-14 LAB — URINALYSIS, ROUTINE W REFLEX MICROSCOPIC
Bilirubin Urine: NEGATIVE
Glucose, UA: NEGATIVE mg/dL
Protein, ur: NEGATIVE mg/dL

## 2013-05-14 LAB — CBC WITH DIFFERENTIAL/PLATELET
Basophils Absolute: 0 10*3/uL (ref 0.0–0.1)
Basophils Relative: 0 % (ref 0–1)
Eosinophils Absolute: 0.1 10*3/uL (ref 0.0–0.7)
Lymphocytes Relative: 9 % — ABNORMAL LOW (ref 12–46)
MCHC: 35.1 g/dL (ref 30.0–36.0)
Monocytes Absolute: 0.7 10*3/uL (ref 0.1–1.0)
Neutro Abs: 10 10*3/uL — ABNORMAL HIGH (ref 1.7–7.7)
Neutrophils Relative %: 83 % — ABNORMAL HIGH (ref 43–77)
Platelets: 265 10*3/uL (ref 150–400)
RDW: 12.2 % (ref 11.5–15.5)
WBC: 12 10*3/uL — ABNORMAL HIGH (ref 4.0–10.5)

## 2013-05-14 LAB — MAGNESIUM: Magnesium: 1.9 mg/dL (ref 1.5–2.5)

## 2013-05-14 LAB — COMPREHENSIVE METABOLIC PANEL
ALT: 24 U/L (ref 0–35)
AST: 16 U/L (ref 0–37)
Albumin: 3.8 g/dL (ref 3.5–5.2)
Chloride: 100 mEq/L (ref 96–112)
Creatinine, Ser: 0.68 mg/dL (ref 0.50–1.10)
Potassium: 3.1 mEq/L — ABNORMAL LOW (ref 3.5–5.1)
Sodium: 139 mEq/L (ref 135–145)
Total Bilirubin: 0.4 mg/dL (ref 0.3–1.2)
Total Protein: 6.4 g/dL (ref 6.0–8.3)

## 2013-05-14 LAB — URINE MICROSCOPIC-ADD ON

## 2013-05-14 MED ORDER — ENOXAPARIN SODIUM 40 MG/0.4ML ~~LOC~~ SOLN
40.0000 mg | SUBCUTANEOUS | Status: DC
  Administered 2013-05-14: 40 mg via SUBCUTANEOUS
  Filled 2013-05-14: qty 0.4

## 2013-05-14 MED ORDER — PROMETHAZINE HCL 25 MG/ML IJ SOLN
25.0000 mg | Freq: Once | INTRAMUSCULAR | Status: AC
  Administered 2013-05-14: 25 mg via INTRAMUSCULAR
  Filled 2013-05-14: qty 1

## 2013-05-14 MED ORDER — DEXTROSE-NACL 5-0.9 % IV SOLN
INTRAVENOUS | Status: DC
  Filled 2013-05-14 (×7): qty 1000

## 2013-05-14 MED ORDER — SODIUM CHLORIDE 0.9 % IV BOLUS (SEPSIS)
1000.0000 mL | Freq: Once | INTRAVENOUS | Status: AC
  Administered 2013-05-14: 1000 mL via INTRAVENOUS

## 2013-05-14 MED ORDER — ONDANSETRON 8 MG/NS 50 ML IVPB
8.0000 mg | INTRAVENOUS | Status: DC | PRN
  Administered 2013-05-14 – 2013-05-20 (×19): 8 mg via INTRAVENOUS
  Filled 2013-05-14 (×15): qty 8

## 2013-05-14 MED ORDER — KCL IN DEXTROSE-NACL 40-5-0.9 MEQ/L-%-% IV SOLN
INTRAVENOUS | Status: DC
  Administered 2013-05-14: 125 mL/h via INTRAVENOUS
  Administered 2013-05-15: 05:00:00 via INTRAVENOUS
  Filled 2013-05-14 (×9): qty 1000

## 2013-05-14 MED ORDER — ONDANSETRON HCL 4 MG/2ML IJ SOLN
4.0000 mg | Freq: Three times a day (TID) | INTRAMUSCULAR | Status: AC | PRN
  Administered 2013-05-14 – 2013-05-15 (×2): 4 mg via INTRAVENOUS
  Filled 2013-05-14 (×2): qty 2

## 2013-05-14 MED ORDER — ACETAMINOPHEN 650 MG RE SUPP: 650.0000 mg | Freq: Four times a day (QID) | RECTAL | Status: DC | PRN

## 2013-05-14 MED ORDER — MORPHINE SULFATE 2 MG/ML IJ SOLN
1.0000 mg | INTRAMUSCULAR | Status: DC | PRN
  Administered 2013-05-14 – 2013-05-15 (×3): 2 mg via INTRAVENOUS
  Filled 2013-05-14 (×4): qty 1

## 2013-05-14 MED ORDER — PROMETHAZINE HCL 25 MG/ML IJ SOLN
25.0000 mg | Freq: Four times a day (QID) | INTRAMUSCULAR | Status: DC | PRN
  Administered 2013-05-14 – 2013-05-20 (×19): 25 mg via INTRAVENOUS
  Filled 2013-05-14 (×20): qty 1

## 2013-05-14 MED ORDER — POTASSIUM CHLORIDE 10 MEQ/100ML IV SOLN
10.0000 meq | INTRAVENOUS | Status: AC
  Administered 2013-05-14 (×4): 10 meq via INTRAVENOUS
  Filled 2013-05-14 (×2): qty 100

## 2013-05-14 MED ORDER — ALBUTEROL SULFATE (5 MG/ML) 0.5% IN NEBU: 2.5000 mg | INHALATION_SOLUTION | RESPIRATORY_TRACT | Status: DC | PRN

## 2013-05-14 MED ORDER — VANCOMYCIN HCL 500 MG IV SOLR
500.0000 mg | Freq: Four times a day (QID) | Status: DC
  Administered 2013-05-14 – 2013-05-17 (×12): 500 mg via RECTAL
  Filled 2013-05-14 (×20): qty 500

## 2013-05-14 MED ORDER — ONDANSETRON HCL 4 MG/2ML IJ SOLN
4.0000 mg | Freq: Once | INTRAMUSCULAR | Status: AC
  Administered 2013-05-14: 4 mg via INTRAVENOUS
  Filled 2013-05-14: qty 2

## 2013-05-14 MED ORDER — ACETAMINOPHEN 325 MG PO TABS: 650.0000 mg | ORAL_TABLET | Freq: Four times a day (QID) | ORAL | Status: DC | PRN

## 2013-05-14 MED ORDER — KETOROLAC TROMETHAMINE 30 MG/ML IJ SOLN
30.0000 mg | Freq: Four times a day (QID) | INTRAMUSCULAR | Status: DC | PRN
  Administered 2013-05-14 – 2013-05-15 (×2): 30 mg via INTRAVENOUS
  Filled 2013-05-14 (×2): qty 1

## 2013-05-14 MED ORDER — IPRATROPIUM BROMIDE 0.02 % IN SOLN: 0.5000 mg | RESPIRATORY_TRACT | Status: DC | PRN

## 2013-05-14 MED ORDER — HYDROMORPHONE HCL PF 1 MG/ML IJ SOLN
1.0000 mg | Freq: Once | INTRAMUSCULAR | Status: AC
  Administered 2013-05-14: 1 mg via INTRAVENOUS
  Filled 2013-05-14: qty 1

## 2013-05-14 MED ORDER — GI COCKTAIL ~~LOC~~
30.0000 mL | Freq: Three times a day (TID) | ORAL | Status: DC | PRN
  Administered 2013-05-16 – 2013-05-19 (×6): 30 mL via ORAL
  Filled 2013-05-14 (×7): qty 30

## 2013-05-14 NOTE — ED Notes (Signed)
Pt c/o n/v abd pain, states that she was discharged from hospital yesterday, diagnosed with c-diff, started vomiting again this am, took phenergan but vomited it back up per pt around 11. Dr. Hyacinth Meeker in prior to RN, see edp assessment for further,

## 2013-05-14 NOTE — ED Notes (Signed)
Released from hospital yesterday, diagnosed with c-diff, still has vomiting and diarrhea

## 2013-05-14 NOTE — H&P (Signed)
[facility] History and Physical  GLENNYS SCHORSCH WGN:[id number] DOB: 1980-09-15 DOA: 05/14/2013  Referring physician: Dr Hyacinth Meeker PCP: Colette Ribas, MD  Specialists: None  Chief Complaint: nausea/ emesis/ diarrhea  HPI: Tonya Stewart is a 32 y.o. female  With hx of tobacco abuse recently discharged 1 day PTA after being hospitalized for C Diff colitis who presents to ED with several hour history intractable nausea, emesis, diarrhea, generalized weakness, diffuse abdominal pain worse in the lower quadrants bilaterally. Patient does endorse some chest burning and tightness with emesis as well as subjective fevers, chills, diaphoresis, and some shortness of breath. Patient states unable to afford Oral vancomycin as it was around $200 and she has no insurance. Patient was seen in the emergency room comprehensive metabolic profile obtained on a potassium of 3.1 glucose of 127 otherwise was within normal limits. CBC had a white count of 12.0 with a left shift. We will consulted to admit the patient for intractable nausea vomiting diarrhea and C. difficile colitis.  Review of Systems: The patient denies anorexia, fever, weight loss,, vision loss, decreased hearing, hoarseness, chest pain, syncope, dyspnea on exertion, peripheral edema, balance deficits, hemoptysis, abdominal pain, melena, hematochezia, severe indigestion/heartburn, hematuria, incontinence, genital sores, muscle weakness, suspicious skin lesions, transient blindness, difficulty walking, depression, unusual weight change, abnormal bleeding, enlarged lymph nodes, angioedema, and breast masses.   Past Medical History  Diagnosis Date  . Tobacco abuse 12/13/2012  . E. coli UTI 12/11/2012  . Acute pyelonephritis 12/11/2012  . Hypotension 12/11/2012.    ?early sepsis/dehydration   Past Surgical History  Procedure Laterality Date  . Tubal ligation     Social History:  reports that she has been smoking.  She does not have any  smokeless tobacco history on file. She reports that she does not drink alcohol or use illicit drugs.  Allergies  Allergen Reactions  . Flagyl [Metronidazole] Hives  . Hydrocodone Itching and Nausea And Vomiting    History reviewed. No pertinent family history.   Prior to Admission medications   Medication Sig Start Date End Date Taking? Authorizing Provider  oxyCODONE-acetaminophen (PERCOCET/ROXICET) 5-325 MG per tablet Take 1 tablet by mouth every 4 (four) hours as needed for pain.   Yes Historical Provider, MD  promethazine (PHENERGAN) 25 MG tablet Take 1 tablet (25 mg total) by mouth every 6 (six) hours as needed for nausea. 05/13/13  Yes Erick Blinks, MD  vancomycin (VANCOCIN) 50 mg/mL oral solution Take 2.5 mLs (125 mg total) by mouth every 6 (six) hours. For 14 days 05/13/13   Erick Blinks, MD   Physical Exam: Filed Vitals:   05/14/13 1549  BP: 126/64  Pulse: 64  Temp: 97.5 F (36.4 C)  Resp: 20     General:  Well-developed well-nourished holding emesis basin in no acute cardiopulmonary distress.  Eyes: Pupils equal round and reactive to light and accommodation. Extraocular movements intact.  ENT: Oropharynx is clear, no lesions, no exudates.  Neck: Supple with no lymphadenopathy.  Cardiovascular: Tachycardic regular rhythm no murmurs rubs or gallops.  Respiratory: Clear to auscultation bilaterally. No wheezes, no crackles, no rhonchi.  Abdomen: Soft, nondistended, tender to palpation diffuse week greater in the lower quadrants. No rebound, no guarding. Positive bowel sounds.  Skin: No rashes or lesions. Patient with tattoos noted on the back.  Musculoskeletal: 4/5 BUE strength, 4/5 BLE strength  Psychiatric: Normal mood. Normal affect. Fair insight. Fair judgment.  Neurologic: Alert and oriented x3. Cranial nerves II through XII are grossly intact.  No focal deficits.  Labs on Admission:  Basic Metabolic Panel:  Recent Labs Lab 05/10/13 1941  05/11/13 0425 05/12/13 0606 05/13/13 0512 05/14/13 1315  NA 144 144 144 142 139  K 3.3* 3.6 4.1 3.6 3.1*  CL 101 109 112 109 100  CO2 24 24 27 25 28   GLUCOSE 144* 123* 96 90 127*  BUN 12 9 6 6  5*  CREATININE 0.65 0.56 0.71 0.67 0.68  CALCIUM 10.6* 8.0* 8.6 8.6 9.3  MG  --   --   --   --  1.9   Liver Function Tests:  Recent Labs Lab 05/11/13 0425 05/12/13 0606 05/14/13 1315  AST 18 24 16   ALT 16 29 24   ALKPHOS 46 44 55  BILITOT 0.2* 0.2* 0.4  PROT 5.6* 5.1* 6.4  ALBUMIN 3.4* 3.0* 3.8    Recent Labs Lab 05/10/13 1941  LIPASE 12   No results found for this basename: AMMONIA,  in the last 168 hours CBC:  Recent Labs Lab 05/10/13 1941 05/11/13 0425 05/12/13 0606 05/14/13 1315  WBC 16.1* 16.9* 9.9 12.0*  NEUTROABS 14.6*  --   --  10.0*  HGB 14.1 11.6* 12.1 13.7  HCT 40.5 33.9* 36.4 39.0  MCV 95.5 96.9 99.2 93.5  PLT 286 218 204 265   Cardiac Enzymes: No results found for this basename: CKTOTAL, CKMB, CKMBINDEX, TROPONINI,  in the last 168 hours  BNP (last 3 results) No results found for this basename: PROBNP,  in the last 8760 hours CBG: No results found for this basename: GLUCAP,  in the last 168 hours  Radiological Exams on Admission: No results found.  EKG: None  Assessment/Plan Principal Problem:   Enteritis due to Clostridium difficile Active Problems:   Nausea and vomiting   Dehydration   Hypokalemia   Abdominal pain, generalized   Tobacco abuse   Leukocytosis  #1 C. difficile colitis Patient recently diagnosed with a C. difficile colitis which was positive on 05/12/2013. Patient was discharged home one day prior to admission however returning back with intractable nausea and vomiting. Patient stated unable to also afford oral vancomycin which was prescribed to her. We'll admit patient to MedSurg floor. Place on IV fluids, vancomycin enema, anti-emetics, supportive care.  #2 nausea and vomiting Likely secondary to problem #1. Will check  an acute abdominal series. Keep n.p.o. except ice chips. Antiemetics. IV fluids, supportive care.  #3 hypokalemia Likely secondary to GI losses. Check a magnesium level. Replete.  #4 generalized abdominal pain Likely secondary to problem #1. Will check an acute abdominal series. Follow.  #5 tobacco abuse Tobacco cessation. Place on a nicotine patch.  #6 leukocytosis Likely secondary to problem #1. Will check a UA with cultures and sensitivities. Check a chest x-ray. Will place patient on vancomycin enema. Follow.  #7 prophylaxis Lovenox for DVT prophylaxis.   Code Status: Full Family Communication:Updated patient no family at bedside. Disposition Plan: Admit to med surg  Time spent: 65 mins  [facility] Pager [phone]  If 7PM-7AM, please contact night-coverage [email or web address] Password [facility] 05/14/2013, 3:56 PM

## 2013-05-14 NOTE — ED Notes (Signed)
Pt states that she is having abd pain, Dr. Hyacinth Meeker notified additional orders given, pt has thrown up X1 after being medication with zofran,

## 2013-05-14 NOTE — ED Provider Notes (Signed)
CSN: [id number]     Arrival date & time 05/14/13  1213 History  This chart was scribed for Vida Roller, MD by Ardelia Mems, ED Scribe. This patient was seen in room APA09/APA09 and the patient's care was started at 1:00 PM.    No chief complaint on file.   The history is provided by the patient. No language interpreter was used.    HPI Comments: Tonya Stewart is a 32 y.o. female who presents to the Emergency Department complaining of persistent emesis and bloody diarrhea. She was just released last night from a 3 day hospital stay for vomiting and diarrhea that was classified as C. Diff.. She states that she was feeling better after she was discharged, but that her symptoms have returned. She states that she has taken oral phenergan without relief. She states that there are no modifying factors. She states that she is not sure where her infection came from. She states that she has not taken antibiotics recently. Currently, she is on the floor is the bathroom currently crying, hunched over and repeatedly vomiting. She is a current every day smoker of 1 pac/day of 15 years and admits to regular marijuana use.  PCP- Dr. Assunta Found   Past Medical History  Diagnosis Date  . Tobacco abuse 12/13/2012  . E. coli UTI 12/11/2012  . Acute pyelonephritis 12/11/2012  . Hypotension 12/11/2012.    ?early sepsis/dehydration   Past Surgical History  Procedure Laterality Date  . Tubal ligation     No family history on file. History  Substance Use Topics  . Smoking status: Current Every Day Smoker -- 1.00 packs/day for 15 years  . Smokeless tobacco: Not on file  . Alcohol Use: No   OB History   Grav Para Term Preterm Abortions TAB SAB Ect Mult Living                 Review of Systems  Gastrointestinal: Positive for nausea, vomiting, diarrhea and blood in stool.  All other systems reviewed and are negative.   Allergies  Flagyl and Hydrocodone  Home Medications   Current Outpatient Rx   Name  Route  Sig  Dispense  Refill  . oxyCODONE-acetaminophen (PERCOCET/ROXICET) 5-325 MG per tablet   Oral   Take 1 tablet by mouth every 4 (four) hours as needed for pain.         . promethazine (PHENERGAN) 25 MG tablet   Oral   Take 1 tablet (25 mg total) by mouth every 6 (six) hours as needed for nausea.   20 tablet   0   . vancomycin (VANCOCIN) 50 mg/mL oral solution   Oral   Take 2.5 mLs (125 mg total) by mouth every 6 (six) hours. For 14 days   140 mL   0    Triage Vitals: BP 107/51  Pulse 60  Temp(Src) 98.6 F (37 C) (Rectal)  Resp 18  SpO2 100%  LMP 04/14/2013  Physical Exam  Nursing note and vitals reviewed. Constitutional: She is oriented to person, place, and time. She appears well-developed and well-nourished. No distress.  Appeared uncomfortable.  HENT:  Head: Normocephalic and atraumatic.  Eyes: EOM are normal.  Neck: Neck supple. No tracheal deviation present.  Cardiovascular: Normal rate.   Murmur heard. Soft systolic murmur.  Pulmonary/Chest: Effort normal. No respiratory distress.  Abdominal: Soft. There is tenderness. There is no guarding.  Diffuse abdominal tenderness. Very soft abdomen without guarding. Increased bowel sounds.  Musculoskeletal: Normal range of  motion.  Neurological: She is alert and oriented to person, place, and time.  Skin: Skin is warm and dry.  Psychiatric: She has a normal mood and affect. Her behavior is normal.    ED Course  Procedures (including critical care time)  DIAGNOSTIC STUDIES: Oxygen Saturation is 100% on RA, normal by my interpretation.    COORDINATION OF CARE: 1:06 PM- Pt advised of plan for treatment and pt agrees.  2:26 PM- Recheck with pt and she states that she is feeling better after receiving medications in the ED.  Medications  sodium chloride 0.9 % bolus 1,000 mL (not administered)  sodium chloride 0.9 % bolus 1,000 mL (1,000 mLs Intravenous New Bag/Given 05/14/13 1334)  ondansetron  (ZOFRAN) injection 4 mg (4 mg Intravenous Given 05/14/13 1335)  promethazine (PHENERGAN) injection 25 mg (25 mg Intramuscular Given 05/14/13 1336)  HYDROmorphone (DILAUDID) injection 1 mg (1 mg Intravenous Given 05/14/13 1410)   Labs Review Labs Reviewed  CBC WITH DIFFERENTIAL - Abnormal; Notable for the following:    WBC 12.0 (*)    Neutrophils Relative % 83 (*)    Neutro Abs 10.0 (*)    Lymphocytes Relative 9 (*)    All other components within normal limits  COMPREHENSIVE METABOLIC PANEL - Abnormal; Notable for the following:    Potassium 3.1 (*)    Glucose, Bld 127 (*)    BUN 5 (*)    All other components within normal limits  URINALYSIS, ROUTINE W REFLEX MICROSCOPIC   Imaging Review No results found.  EKG Interpretation   None       MDM   1. C. difficile colitis   2. Nausea and vomiting    Pt with ongoing sx related to her infectious colitis - fluids given, symptomatic meds as below - d/w hospitalist re: admission for inability to tolerate PO  Meds given in ED:  Medications  sodium chloride 0.9 % bolus 1,000 mL (not administered)  sodium chloride 0.9 % bolus 1,000 mL (1,000 mLs Intravenous New Bag/Given 05/14/13 1334)  ondansetron (ZOFRAN) injection 4 mg (4 mg Intravenous Given 05/14/13 1335)  promethazine (PHENERGAN) injection 25 mg (25 mg Intramuscular Given 05/14/13 1336)  HYDROmorphone (DILAUDID) injection 1 mg (1 mg Intravenous Given 05/14/13 1410)    I personally performed the services described in this documentation, which was scribed in my presence. The recorded information has been reviewed and is accurate.      Vida Roller, MD 05/14/13 [id number]

## 2013-05-14 NOTE — ED Notes (Signed)
Report given to Tamkea 300,

## 2013-05-14 NOTE — ED Notes (Signed)
hospitalist at bedside, pt c/o nausea and pain that has returned,

## 2013-05-15 ENCOUNTER — Encounter (HOSPITAL_COMMUNITY): Payer: Medicaid Other

## 2013-05-15 DIAGNOSIS — E860 Dehydration: Secondary | ICD-10-CM

## 2013-05-15 DIAGNOSIS — R911 Solitary pulmonary nodule: Secondary | ICD-10-CM | POA: Diagnosis present

## 2013-05-15 LAB — BASIC METABOLIC PANEL
CO2: 28 mEq/L (ref 19–32)
GFR calc Af Amer: >90 mL/min (ref >90)
GFR calc non Af Amer: >90 mL/min (ref >90)
Glucose, Bld: 130 mg/dL — ABNORMAL HIGH (ref 70–99)
Potassium: 3.3 mEq/L — ABNORMAL LOW (ref 3.5–5.1)
Sodium: 141 mEq/L (ref 135–145)

## 2013-05-15 LAB — CBC
Hemoglobin: 12 g/dL (ref 12.0–15.0)
MCH: 33.1 pg (ref 26.0–34.0)
MCV: 94.2 fL (ref 78.0–100.0)
RBC: 3.62 MIL/uL — ABNORMAL LOW (ref 3.87–5.11)

## 2013-05-15 LAB — HEMOGLOBIN A1C
Hgb A1c MFr Bld: 5.3 % (ref <5.7)
Mean Plasma Glucose: 105 mg/dL (ref <117)

## 2013-05-15 MED ORDER — POTASSIUM CHLORIDE 10 MEQ/100ML IV SOLN
10.0000 meq | INTRAVENOUS | Status: AC
  Administered 2013-05-15: 10 meq via INTRAVENOUS
  Filled 2013-05-15: qty 100

## 2013-05-15 MED ORDER — POTASSIUM CHLORIDE CRYS ER 20 MEQ PO TBCR
40.0000 meq | EXTENDED_RELEASE_TABLET | Freq: Once | ORAL | Status: DC
  Filled 2013-05-15: qty 2

## 2013-05-15 MED ORDER — FAMOTIDINE IN NACL 20-0.9 MG/50ML-% IV SOLN
20.0000 mg | Freq: Once | INTRAVENOUS | Status: AC
  Administered 2013-05-15: 20 mg via INTRAVENOUS
  Filled 2013-05-15: qty 50

## 2013-05-15 MED ORDER — FAMOTIDINE IN NACL 20-0.9 MG/50ML-% IV SOLN
20.0000 mg | INTRAVENOUS | Status: DC
  Administered 2013-05-15 – 2013-05-19 (×5): 20 mg via INTRAVENOUS
  Filled 2013-05-15 (×6): qty 50

## 2013-05-15 MED ORDER — TRAZODONE HCL 50 MG PO TABS
50.0000 mg | ORAL_TABLET | Freq: Every evening | ORAL | Status: DC | PRN
  Administered 2013-05-15 – 2013-05-19 (×4): 50 mg via ORAL
  Filled 2013-05-15 (×4): qty 1

## 2013-05-15 MED ORDER — ONDANSETRON HCL 4 MG/2ML IJ SOLN
INTRAMUSCULAR | Status: AC
  Filled 2013-05-15: qty 4

## 2013-05-15 MED ORDER — MORPHINE SULFATE 2 MG/ML IJ SOLN
2.0000 mg | INTRAMUSCULAR | Status: DC | PRN
  Administered 2013-05-15 – 2013-05-16 (×8): 2 mg via INTRAVENOUS
  Filled 2013-05-15 (×9): qty 1

## 2013-05-15 MED ORDER — FAMOTIDINE IN NACL 20-0.9 MG/50ML-% IV SOLN
INTRAVENOUS | Status: AC
  Filled 2013-05-15: qty 50

## 2013-05-15 NOTE — Progress Notes (Signed)
Attempted PICC insertion to left arm pt uncooperative, pulling back and asked to have procedure stopped. Benefits and risk re-explained and pt now refuses for procedure to be resumed. Patients RN made aware and will discuss with MD.

## 2013-05-15 NOTE — Progress Notes (Signed)
Discussed the patients care with both Dr. Janee Morn and Dr. Maryelizabeth Kaufmann.  Voiced to them the patient continues to vomit even with intervention.  She also states that her pain is not being controlled with the current Morphine order and requests Dilaudid.  Dr. Janee Morn states that he would like to try changing her Morphine around instead.  After Dr. Jomarie Longs examined her she says that if she does not vomit by 2 pm today I can increase her diet and to page her and she will change her PR vanc to PO.  The patient continued to vomit today so, no changes were made.  Dr. Janee Morn was notified that None were made.

## 2013-05-15 NOTE — Progress Notes (Signed)
Nutrition Brief Note  Patient identified on the Malnutrition Screening Tool (MST) Report  Patient Active Problem List   Diagnosis Date Noted  . Lung nodule 05/15/2013  . Leukocytosis 05/14/2013  . Enteritis due to Clostridium difficile 05/12/2013  . Bradycardia 05/11/2013  . Lactic acidosis 05/11/2013  . Tobacco abuse 12/13/2012  . Diarrhea 12/12/2012  . Sepsis 12/12/2012  . Nausea and vomiting 12/11/2012  . Acute pyelonephritis 12/11/2012  . E. coli UTI 12/11/2012  . Dehydration 12/11/2012  . Hypokalemia 12/11/2012  . Abdominal pain, generalized 12/11/2012   Sodium  Date/Time Value Range Status  05/15/2013  5:36 AM 141  135 - 145 mEq/L Final  05/14/2013  1:15 PM 139  135 - 145 mEq/L Final  05/13/2013  5:12 AM 142  135 - 145 mEq/L Final    Potassium  Date/Time Value Range Status  05/15/2013  5:36 AM 3.3* 3.5 - 5.1 mEq/L Final  05/14/2013  1:15 PM 3.1* 3.5 - 5.1 mEq/L Final  05/13/2013  5:12 AM 3.6  3.5 - 5.1 mEq/L Final    No results found for this basename: phos    Magnesium  Date/Time Value Range Status  05/14/2013  1:15 PM 1.9  1.5 - 2.5 mg/dL Final  1/61/0960 45:40 PM 2.3  1.5 - 2.5 mg/dL Final    Wt Readings from Last 15 Encounters:  05/14/13 185 lb 6.5 oz (84.1 kg)  05/10/13 185 lb 13.6 oz (84.3 kg)  12/11/12 180 lb (81.647 kg)  12/08/12 180 lb (81.647 kg)  Weight without significant change.  Body mass index is 30.85 kg/(m^2). Patient meets criteria for Obesity Class I based on current BMI.  Current diet NPO. C.Diff  Colitis. Afebrile. No emesis since admission noted. She is to advance to clear liquids later today. Smokes 2 packs daily. Labs and medications reviewed.    No nutrition interventions warranted at this time. If nutrition issues arise, please consult RD.   Royann Shivers MS,RD,LDN Office: [phone] Pager: [phone]

## 2013-05-15 NOTE — Progress Notes (Signed)
[facility] PROGRESS NOTE  NEVADA KIRCHNER ZOX:[id number] DOB: February 17, 1981 DOA: 05/14/2013 PCP: Colette Ribas, MD  Assessment/Plan: 1. Cdiff Colitis -incomplete treatment, reportedly was unable to afford PO Vanc -Currently on Vanc enema, switch to Po this afternoon if no further vomiting -start clears this afternoon -CM consult for med assistance  2.  Hypokalemia -replace, from GI losses -bmet in am  3. Tobacco abuse -counseled, declines nicotine patch  4. Mild hyperglycemia -check Hbaic  DVT proph: lovenox  Code Status: Full Family Communication: none at bedside Disposition Plan: home when improved   Antibiotics:  Po Vanc  HPI/Subjective: Vomited this am, no further diarrhea  Objective: Filed Vitals:   05/15/13 0654  BP: 138/83  Pulse: 61  Temp: 98.7 F (37.1 C)  Resp: 20    Intake/Output Summary (Last 24 hours) at 05/15/13 1244 Last data filed at 05/15/13 0900  Gross per 24 hour  Intake 3043.75 ml  Output      0 ml  Net 3043.75 ml   Filed Weights   05/14/13 1753  Weight: 84.1 kg (185 lb 6.5 oz)    Exam:   General:  AAOx3, anxious but comfortable appearing  Cardiovascular: S1S2/RRR  Respiratory: CTAB  Abdomen: soft, obese, ND, NT, BS present  Musculoskeletal: no edema c/c  Data Reviewed: Basic Metabolic Panel:  Recent Labs Lab 05/11/13 0425 05/12/13 0606 05/13/13 0512 05/14/13 1315 05/15/13 0536  NA 144 144 142 139 141  K 3.6 4.1 3.6 3.1* 3.3*  CL 109 112 109 100 103  CO2 24 27 25 28 28   GLUCOSE 123* 96 90 127* 130*  BUN 9 6 6  5* 6  CREATININE 0.56 0.71 0.67 0.68 0.59  CALCIUM 8.0* 8.6 8.6 9.3 8.8  MG  --   --   --  1.9  --    Liver Function Tests:  Recent Labs Lab 05/11/13 0425 05/12/13 0606 05/14/13 1315  AST 18 24 16   ALT 16 29 24   ALKPHOS 46 44 55  BILITOT 0.2* 0.2* 0.4  PROT 5.6* 5.1* 6.4  ALBUMIN 3.4* 3.0* 3.8    Recent Labs Lab 05/10/13 1941  LIPASE 12   No results found for this  basename: AMMONIA,  in the last 168 hours CBC:  Recent Labs Lab 05/10/13 1941 05/11/13 0425 05/12/13 0606 05/14/13 1315 05/15/13 0536  WBC 16.1* 16.9* 9.9 12.0* 16.1*  NEUTROABS 14.6*  --   --  10.0*  --   HGB 14.1 11.6* 12.1 13.7 12.0  HCT 40.5 33.9* 36.4 39.0 34.1*  MCV 95.5 96.9 99.2 93.5 94.2  PLT 286 218 204 265 247   Cardiac Enzymes: No results found for this basename: CKTOTAL, CKMB, CKMBINDEX, TROPONINI,  in the last 168 hours BNP (last 3 results) No results found for this basename: PROBNP,  in the last 8760 hours CBG: No results found for this basename: GLUCAP,  in the last 168 hours  Recent Results (from the past 240 hour(s))  CULTURE, BLOOD (ROUTINE X 2)     Status: None   Collection Time    05/10/13  9:10 PM      Result Value Range Status   Specimen Description BLOOD LEFT ANTECUBITAL   Final   Special Requests BOTTLES DRAWN AEROBIC AND ANAEROBIC 6CC   Final   Culture NO GROWTH 4 DAYS   Final   Report Status PENDING   Incomplete  CULTURE, BLOOD (ROUTINE X 2)     Status: None   Collection Time    05/10/13  9:10 PM      Result Value Range Status   Specimen Description BLOOD LEFT HAND   Final   Special Requests BOTTLES DRAWN AEROBIC AND ANAEROBIC 6CC   Final   Culture NO GROWTH 4 DAYS   Final   Report Status PENDING   Incomplete  MRSA PCR SCREENING     Status: None   Collection Time    05/10/13 11:35 PM      Result Value Range Status   MRSA by PCR NEGATIVE  NEGATIVE Final   Comment:            The GeneXpert MRSA Assay (FDA     approved for NASAL specimens     only), is one component of a     comprehensive MRSA colonization     surveillance program. It is not     intended to diagnose MRSA     infection nor to guide or     monitor treatment for     MRSA infections.  CLOSTRIDIUM DIFFICILE BY PCR     Status: Abnormal   Collection Time    05/12/13  4:00 PM      Result Value Range Status   C difficile by pcr POSITIVE (*) NEGATIVE Final   Comment: CRITICAL  RESULT CALLED TO, READ BACK BY AND VERIFIED WITH:     FORTE,L. AT 1825 ON 05/12/2013 BY BAUGHAM,M.     Studies: Dg Abd Acute W/chest  05/14/2013   CLINICAL DATA:  Nausea, vomiting, abdominal pain  EXAM: ACUTE ABDOMEN SERIES (ABDOMEN 2 VIEW & CHEST 1 VIEW)  COMPARISON:  05/10/2013, 01/22/2011  FINDINGS: There is no evidence of dilated bowel loops or free intraperitoneal air. No radiopaque calculi or other significant radiographic abnormality is seen. Heart size and mediastinal contours are within normal limits. Both lungs are clear.  IMPRESSION: Negative abdominal radiographs.  No acute cardiopulmonary disease.   Electronically Signed   By: Ruel Favors M.D.   On: 05/14/2013 16:34    Scheduled Meds: . famotidine (PEPCID) IV  20 mg Intravenous Q24H  . potassium chloride  10 mEq Intravenous Q1 Hr x 4  . vancomycin (VANCOCIN) rectal ENEMA  500 mg Rectal Q6H   Continuous Infusions: . dextrose 5 % and 0.9 % NaCl with KCl 40 mEq/L 125 mL/hr at 05/15/13 0504    Principal Problem:   Enteritis due to Clostridium difficile Active Problems:   Nausea and vomiting   Dehydration   Hypokalemia   Abdominal pain, generalized   Tobacco abuse   Leukocytosis   Lung nodule    Time spent:    [facility]  [facility] Pager [phone]. If 7PM-7AM, please contact night-coverage at [email or web address], password [facility] 05/15/2013, 12:44 PM  LOS: 1 day

## 2013-05-16 LAB — CBC
MCH: 33.4 pg (ref 26.0–34.0)
MCHC: 34.7 g/dL (ref 30.0–36.0)
Platelets: ADEQUATE 10*3/uL (ref 150–400)
RBC: 3.62 MIL/uL — ABNORMAL LOW (ref 3.87–5.11)

## 2013-05-16 LAB — BASIC METABOLIC PANEL
CO2: 26 mEq/L (ref 19–32)
Calcium: 8.7 mg/dL (ref 8.4–10.5)
Chloride: 105 mEq/L (ref 96–112)
GFR calc non Af Amer: >90 mL/min (ref >90)
Glucose, Bld: 121 mg/dL — ABNORMAL HIGH (ref 70–99)
Potassium: 4.4 mEq/L (ref 3.5–5.1)
Sodium: 140 mEq/L (ref 135–145)

## 2013-05-16 LAB — CULTURE, BLOOD (ROUTINE X 2): Culture: NO GROWTH

## 2013-05-16 MED ORDER — METOCLOPRAMIDE HCL 5 MG/ML IJ SOLN
10.0000 mg | Freq: Once | INTRAMUSCULAR | Status: AC
  Administered 2013-05-16: 10 mg via INTRAVENOUS
  Filled 2013-05-16: qty 2

## 2013-05-16 MED ORDER — KETOROLAC TROMETHAMINE 30 MG/ML IJ SOLN
30.0000 mg | Freq: Four times a day (QID) | INTRAMUSCULAR | Status: DC | PRN
  Administered 2013-05-16 – 2013-05-20 (×9): 30 mg via INTRAVENOUS
  Filled 2013-05-16 (×9): qty 1

## 2013-05-16 MED ORDER — SODIUM CHLORIDE 0.9 % IV SOLN
INTRAVENOUS | Status: DC
  Administered 2013-05-16: 100 mL/h via INTRAVENOUS
  Administered 2013-05-17 – 2013-05-19 (×6): via INTRAVENOUS

## 2013-05-16 MED ORDER — ONDANSETRON HCL 4 MG/2ML IJ SOLN
INTRAMUSCULAR | Status: AC
  Filled 2013-05-16: qty 4

## 2013-05-16 MED ORDER — HYDROMORPHONE HCL PF 1 MG/ML IJ SOLN
1.0000 mg | INTRAMUSCULAR | Status: DC | PRN
  Administered 2013-05-16 – 2013-05-18 (×10): 1 mg via INTRAVENOUS
  Filled 2013-05-16 (×10): qty 1

## 2013-05-16 NOTE — Progress Notes (Signed)
[facility] PROGRESS NOTE  Tonya Stewart WUJ:[id number] DOB: 1981/06/28 DOA: 05/14/2013 PCP: Colette Ribas, MD  Assessment/Plan: 1. Cdiff Colitis -incomplete treatment, reportedly was unable to afford PO Vanc -Currently on Vanc enema Will switch to oral vancomycin when no further emesis. -start clears this afternoon -CM consult for med assistance  2.  Hypokalemia -from GI losses. Repleted -bmet in am  3. Tobacco abuse -counseled, declines nicotine patch  4. Mild hyperglycemia -Hbac 5.3.  DVT proph: lovenox  Code Status: Full Family Communication: none at bedside Disposition Plan: home when improved   Antibiotics:  Rectal Vanc 05/14/2013  HPI/Subjective: Patient still with nausea and emesis. Patient still complains of abdominal pain chest pain and back pain.  Objective: Filed Vitals:   05/16/13 1426  BP: 128/68  Pulse: 57  Temp: 98 F (36.7 C)  Resp: 20    Intake/Output Summary (Last 24 hours) at 05/16/13 1836 Last data filed at 05/16/13 1300  Gross per 24 hour  Intake 2989.58 ml  Output      0 ml  Net 2989.58 ml   Filed Weights   05/14/13 1753  Weight: 84.1 kg (185 lb 6.5 oz)    Exam:   General:  AAOx3, anxious but comfortable appearing  Cardiovascular: RRR  Respiratory: CTAB  Abdomen: soft, obese, ND, NT, BS present  Musculoskeletal: no edema c/c  Data Reviewed: Basic Metabolic Panel:  Recent Labs Lab 05/12/13 0606 05/13/13 0512 05/14/13 1315 05/15/13 0536 05/16/13 0719  NA 144 142 139 141 140  K 4.1 3.6 3.1* 3.3* 4.4  CL 112 109 100 103 105  CO2 27 25 28 28 26   GLUCOSE 96 90 127* 130* 121*  BUN 6 6 5* 6 6  CREATININE 0.71 0.67 0.68 0.59 0.62  CALCIUM 8.6 8.6 9.3 8.8 8.7  MG  --   --  1.9  --   --    Liver Function Tests:  Recent Labs Lab 05/11/13 0425 05/12/13 0606 05/14/13 1315  AST 18 24 16   ALT 16 29 24   ALKPHOS 46 44 55  BILITOT 0.2* 0.2* 0.4  PROT 5.6* 5.1* 6.4  ALBUMIN 3.4* 3.0* 3.8     Recent Labs Lab 05/10/13 1941  LIPASE 12   No results found for this basename: AMMONIA,  in the last 168 hours CBC:  Recent Labs Lab 05/10/13 1941 05/11/13 0425 05/12/13 0606 05/14/13 1315 05/15/13 0536 05/16/13 0719  WBC 16.1* 16.9* 9.9 12.0* 16.1* 15.0*  NEUTROABS 14.6*  --   --  10.0*  --   --   HGB 14.1 11.6* 12.1 13.7 12.0 12.1  HCT 40.5 33.9* 36.4 39.0 34.1* 34.9*  MCV 95.5 96.9 99.2 93.5 94.2 96.4  PLT 286 218 204 265 247 PLATELET CLUMPS NOTED ON SMEAR, COUNT APPEARS ADEQUATE   Cardiac Enzymes: No results found for this basename: CKTOTAL, CKMB, CKMBINDEX, TROPONINI,  in the last 168 hours BNP (last 3 results) No results found for this basename: PROBNP,  in the last 8760 hours CBG: No results found for this basename: GLUCAP,  in the last 168 hours  Recent Results (from the past 240 hour(s))  CULTURE, BLOOD (ROUTINE X 2)     Status: None   Collection Time    05/10/13  9:10 PM      Result Value Range Status   Specimen Description BLOOD LEFT ANTECUBITAL   Final   Special Requests BOTTLES DRAWN AEROBIC AND ANAEROBIC 6CC   Final   Culture NO GROWTH 6 DAYS   Final  Report Status 05/16/2013 FINAL   Final  CULTURE, BLOOD (ROUTINE X 2)     Status: None   Collection Time    05/10/13  9:10 PM      Result Value Range Status   Specimen Description BLOOD LEFT HAND   Final   Special Requests BOTTLES DRAWN AEROBIC AND ANAEROBIC 6CC   Final   Culture NO GROWTH 6 DAYS   Final   Report Status 05/16/2013 FINAL   Final  MRSA PCR SCREENING     Status: None   Collection Time    05/10/13 11:35 PM      Result Value Range Status   MRSA by PCR NEGATIVE  NEGATIVE Final   Comment:            The GeneXpert MRSA Assay (FDA     approved for NASAL specimens     only), is one component of a     comprehensive MRSA colonization     surveillance program. It is not     intended to diagnose MRSA     infection nor to guide or     monitor treatment for     MRSA infections.   CLOSTRIDIUM DIFFICILE BY PCR     Status: Abnormal   Collection Time    05/12/13  4:00 PM      Result Value Range Status   C difficile by pcr POSITIVE (*) NEGATIVE Final   Comment: CRITICAL RESULT CALLED TO, READ BACK BY AND VERIFIED WITH:     FORTE,L. AT 1825 ON 05/12/2013 BY BAUGHAM,M.  URINE CULTURE     Status: None   Collection Time    05/14/13  3:15 PM      Result Value Range Status   Specimen Description URINE, CATHETERIZED   Final   Special Requests NONE   Final   Culture  Setup Time     Final   Value: 05/14/2013 22:56     Performed at [facility] Count     Final   Value: 20,OOO COLONIES/ML     Performed at [facility]   Culture     Final   Value: STAPHYLOCOCCUS SPECIES (COAGULASE NEGATIVE)     Note: RIFAMPIN AND GENTAMICIN SHOULD NOT BE USED AS SINGLE DRUGS FOR TREATMENT OF STAPH INFECTIONS.     Performed at [facility]   Report Status PENDING   Incomplete     Studies: No results found.  Scheduled Meds: . famotidine (PEPCID) IV  20 mg Intravenous Q24H  . vancomycin (VANCOCIN) rectal ENEMA  500 mg Rectal Q6H   Continuous Infusions: . sodium chloride 100 mL/hr (05/16/13 1400)    Principal Problem:   Enteritis due to Clostridium difficile Active Problems:   Nausea and vomiting   Dehydration   Hypokalemia   Abdominal pain, generalized   Tobacco abuse   Leukocytosis   Lung nodule    Time spent:    [facility]  [facility] Pager [phone]. If 7PM-7AM, please contact night-coverage at [email or web address], password [facility] 05/16/2013, 6:36 PM  LOS: 2 days

## 2013-05-16 NOTE — Progress Notes (Signed)
Dr. Janee Morn notified of the patient stating that the GI cocktail helped the patients throat and chest, but not her stomach and back.  New orders given and followed.

## 2013-05-17 LAB — BASIC METABOLIC PANEL
BUN: 9 mg/dL (ref 6–23)
CO2: 27 mEq/L (ref 19–32)
Chloride: 103 mEq/L (ref 96–112)
Creatinine, Ser: 0.67 mg/dL (ref 0.50–1.10)
GFR calc Af Amer: >90 mL/min (ref >90)
GFR calc non Af Amer: >90 mL/min (ref >90)
Potassium: 3.7 mEq/L (ref 3.5–5.1)
Sodium: 138 mEq/L (ref 135–145)

## 2013-05-17 LAB — URINE CULTURE

## 2013-05-17 LAB — CBC
Hemoglobin: 12.2 g/dL (ref 12.0–15.0)
Platelets: 202 10*3/uL (ref 150–400)
RBC: 3.71 MIL/uL — ABNORMAL LOW (ref 3.87–5.11)

## 2013-05-17 MED ORDER — ONDANSETRON HCL 4 MG/2ML IJ SOLN
INTRAMUSCULAR | Status: AC
  Filled 2013-05-17: qty 4

## 2013-05-17 MED ORDER — VANCOMYCIN 50 MG/ML ORAL SOLUTION
125.0000 mg | Freq: Four times a day (QID) | ORAL | Status: DC
  Administered 2013-05-18 – 2013-05-20 (×11): 125 mg via ORAL
  Filled 2013-05-17 (×15): qty 2.5

## 2013-05-17 MED ORDER — LORAZEPAM 0.5 MG PO TABS
0.5000 mg | ORAL_TABLET | Freq: Once | ORAL | Status: AC
  Administered 2013-05-17: 0.5 mg via SUBLINGUAL
  Filled 2013-05-17: qty 1

## 2013-05-17 MED ORDER — VANCOMYCIN 50 MG/ML ORAL SOLUTION
125.0000 mg | Freq: Once | ORAL | Status: AC
  Administered 2013-05-17: 125 mg via ORAL
  Filled 2013-05-17: qty 2.5

## 2013-05-17 NOTE — Progress Notes (Signed)
Notified Dr. Janee Morn that the patient thinks that she can tolerate the po dose of vancomycin.  Md states that we will try a one time dose and see how she tolerates. I will monitor her.

## 2013-05-17 NOTE — Clinical Social Work Note (Signed)
CSW received referral for medication assistance. Notified CM. CSW will sign off but can be reconsulted if needed.  Derenda Fennel, [place] [phone]

## 2013-05-17 NOTE — Progress Notes (Signed)
UR Chart Review Completed  

## 2013-05-17 NOTE — Progress Notes (Signed)
[facility] PROGRESS NOTE  Tonya Stewart ZOX:[id number] DOB: 07-Nov-1980 DOA: 05/14/2013 PCP: Colette Ribas, MD  Assessment/Plan: 1. Cdiff Colitis -incomplete treatment, reportedly was unable to afford PO Vanc -Currently on Vanc enema Will switch to oral vancomycin when no further emesis. -Continue clear liquids. -CM consult for med assistance  2.  Hypokalemia -from GI losses. Repleted -bmet in am  3. Tobacco abuse -counseled, declines nicotine patch  4. Mild hyperglycemia -Hbac 5.3.  DVT proph: lovenox  Code Status: Full Family Communication: none at bedside Disposition Plan: home when improved   Antibiotics:  Rectal Vanc 05/14/2013  HPI/Subjective: Patient still with nausea. Patient states emesis has improved. Patient states chest pain improved with GI cocktail.  Objective: Filed Vitals:   05/17/13 0532  BP: 128/81  Pulse: 70  Temp: 98.4 F (36.9 C)  Resp: 20    Intake/Output Summary (Last 24 hours) at 05/17/13 1519 Last data filed at 05/16/13 1730  Gross per 24 hour  Intake    120 ml  Output      0 ml  Net    120 ml   Filed Weights   05/14/13 1753  Weight: 84.1 kg (185 lb 6.5 oz)    Exam:   General:  NAD  Cardiovascular: RRR  Respiratory: CTAB  Abdomen: soft, obese, ND, NT, BS present  Musculoskeletal: no edema c/c  Data Reviewed: Basic Metabolic Panel:  Recent Labs Lab 05/13/13 0512 05/14/13 1315 05/15/13 0536 05/16/13 0719 05/17/13 0525  NA 142 139 141 140 138  K 3.6 3.1* 3.3* 4.4 3.7  CL 109 100 103 105 103  CO2 25 28 28 26 27   GLUCOSE 90 127* 130* 121* 102*  BUN 6 5* 6 6 9   CREATININE 0.67 0.68 0.59 0.62 0.67  CALCIUM 8.6 9.3 8.8 8.7 8.8  MG  --  1.9  --   --   --    Liver Function Tests:  Recent Labs Lab 05/11/13 0425 05/12/13 0606 05/14/13 1315  AST 18 24 16   ALT 16 29 24   ALKPHOS 46 44 55  BILITOT 0.2* 0.2* 0.4  PROT 5.6* 5.1* 6.4  ALBUMIN 3.4* 3.0* 3.8    Recent Labs Lab 05/10/13 1941   LIPASE 12   No results found for this basename: AMMONIA,  in the last 168 hours CBC:  Recent Labs Lab 05/10/13 1941  05/12/13 0606 05/14/13 1315 05/15/13 0536 05/16/13 0719 05/17/13 0525  WBC 16.1*  < > 9.9 12.0* 16.1* 15.0* 12.1*  NEUTROABS 14.6*  --   --  10.0*  --   --   --   HGB 14.1  < > 12.1 13.7 12.0 12.1 12.2  HCT 40.5  < > 36.4 39.0 34.1* 34.9* 35.7*  MCV 95.5  < > 99.2 93.5 94.2 96.4 96.2  PLT 286  < > 204 265 247 PLATELET CLUMPS NOTED ON SMEAR, COUNT APPEARS ADEQUATE 202  < > = values in this interval not displayed. Cardiac Enzymes: No results found for this basename: CKTOTAL, CKMB, CKMBINDEX, TROPONINI,  in the last 168 hours BNP (last 3 results) No results found for this basename: PROBNP,  in the last 8760 hours CBG: No results found for this basename: GLUCAP,  in the last 168 hours  Recent Results (from the past 240 hour(s))  CULTURE, BLOOD (ROUTINE X 2)     Status: None   Collection Time    05/10/13  9:10 PM      Result Value Range Status   Specimen Description BLOOD  LEFT ANTECUBITAL   Final   Special Requests BOTTLES DRAWN AEROBIC AND ANAEROBIC 6CC   Final   Culture NO GROWTH 6 DAYS   Final   Report Status 05/16/2013 FINAL   Final  CULTURE, BLOOD (ROUTINE X 2)     Status: None   Collection Time    05/10/13  9:10 PM      Result Value Range Status   Specimen Description BLOOD LEFT HAND   Final   Special Requests BOTTLES DRAWN AEROBIC AND ANAEROBIC 6CC   Final   Culture NO GROWTH 6 DAYS   Final   Report Status 05/16/2013 FINAL   Final  MRSA PCR SCREENING     Status: None   Collection Time    05/10/13 11:35 PM      Result Value Range Status   MRSA by PCR NEGATIVE  NEGATIVE Final   Comment:            The GeneXpert MRSA Assay (FDA     approved for NASAL specimens     only), is one component of a     comprehensive MRSA colonization     surveillance program. It is not     intended to diagnose MRSA     infection nor to guide or     monitor treatment  for     MRSA infections.  CLOSTRIDIUM DIFFICILE BY PCR     Status: Abnormal   Collection Time    05/12/13  4:00 PM      Result Value Range Status   C difficile by pcr POSITIVE (*) NEGATIVE Final   Comment: CRITICAL RESULT CALLED TO, READ BACK BY AND VERIFIED WITH:     FORTE,L. AT 1825 ON 05/12/2013 BY BAUGHAM,M.  URINE CULTURE     Status: None   Collection Time    05/14/13  3:15 PM      Result Value Range Status   Specimen Description URINE, CATHETERIZED   Final   Special Requests NONE   Final   Culture  Setup Time     Final   Value: 05/14/2013 22:56     Performed at [facility] Count     Final   Value: 20,OOO COLONIES/ML     Performed at [facility]   Culture     Final   Value: STAPHYLOCOCCUS SPECIES (COAGULASE NEGATIVE)     Note: RIFAMPIN AND GENTAMICIN SHOULD NOT BE USED AS SINGLE DRUGS FOR TREATMENT OF STAPH INFECTIONS.     Performed at [facility]   Report Status 05/17/2013 FINAL   Final   Organism ID, Bacteria STAPHYLOCOCCUS SPECIES (COAGULASE NEGATIVE)   Final     Studies: No results found.  Scheduled Meds: . famotidine (PEPCID) IV  20 mg Intravenous Q24H  . vancomycin (VANCOCIN) rectal ENEMA  500 mg Rectal Q6H   Continuous Infusions: . sodium chloride 100 mL/hr at 05/17/13 0044    Principal Problem:   Enteritis due to Clostridium difficile Active Problems:   Nausea and vomiting   Dehydration   Hypokalemia   Abdominal pain, generalized   Tobacco abuse   Leukocytosis   Lung nodule    Time spent:    [facility]  [facility] Pager [phone]. If 7PM-7AM, please contact night-coverage at [email or web address], password [facility] 05/17/2013, 3:19 PM  LOS: 3 days

## 2013-05-18 LAB — BASIC METABOLIC PANEL
BUN: 8 mg/dL (ref 6–23)
CO2: 29 mEq/L (ref 19–32)
Chloride: 99 mEq/L (ref 96–112)
Creatinine, Ser: 0.7 mg/dL (ref 0.50–1.10)
GFR calc Af Amer: >90 mL/min (ref >90)
Potassium: 3.6 mEq/L (ref 3.5–5.1)
Sodium: 138 mEq/L (ref 135–145)

## 2013-05-18 LAB — CBC
HCT: 39.4 % (ref 36.0–46.0)
Hemoglobin: 13.6 g/dL (ref 12.0–15.0)
MCV: 95.2 fL (ref 78.0–100.0)
RBC: 4.14 MIL/uL (ref 3.87–5.11)
RDW: 12.5 % (ref 11.5–15.5)
WBC: 12.2 10*3/uL — ABNORMAL HIGH (ref 4.0–10.5)

## 2013-05-18 MED ORDER — HYDROMORPHONE HCL PF 1 MG/ML IJ SOLN
0.5000 mg | INTRAMUSCULAR | Status: DC | PRN
  Administered 2013-05-18 – 2013-05-20 (×8): 0.5 mg via INTRAVENOUS
  Filled 2013-05-18 (×8): qty 1

## 2013-05-18 MED ORDER — VANCOMYCIN 50 MG/ML ORAL SOLUTION
ORAL | Status: AC
  Filled 2013-05-18: qty 5

## 2013-05-18 MED ORDER — ONDANSETRON HCL 4 MG/2ML IJ SOLN
INTRAMUSCULAR | Status: AC
  Filled 2013-05-18: qty 4

## 2013-05-18 NOTE — Progress Notes (Signed)
[facility] PROGRESS NOTE  Tonya Stewart:[id number] DOB: May 23, 1981 DOA: 05/14/2013 PCP: Colette Ribas, MD  Assessment/Plan: 1. Cdiff Colitis -incomplete treatment, reportedly was unable to afford PO Vanc -Currently tolerating  oral vancomycin when no further emesis. Patient still with some nausea. Will advance diet to full liquids. Continue antiemetics, IV fluids, supportive care, pain management. -CM consulted for med assistance  2.  Hypokalemia -from GI losses. Repleted -bmet in am  3. Tobacco abuse -counseled, declines nicotine patch  4. Mild hyperglycemia -Hbac 5.3.  DVT proph: lovenox  Code Status: Full Family Communication: none at bedside Disposition Plan: home when improved   Antibiotics:  Rectal Vanc 05/14/2013--->05/17/13  Oral vancomycin 05/17/2013  HPI/Subjective: Patient still with nausea. Patient states emesis has improved. Patient states chest pain improved with GI cocktail. Patient with one loose stool today.  Objective: Filed Vitals:   05/18/13 0551  BP: 111/67  Pulse: 57  Temp: 98.2 F (36.8 C)  Resp: 18    Intake/Output Summary (Last 24 hours) at 05/18/13 1353 Last data filed at 05/18/13 0551  Gross per 24 hour  Intake    240 ml  Output      0 ml  Net    240 ml   Filed Weights   05/14/13 1753  Weight: 84.1 kg (185 lb 6.5 oz)    Exam:   General:  NAD  Cardiovascular: RRR  Respiratory: CTAB  Abdomen: soft, obese, ND, some tenderness in the lower quadrants, BS present  Musculoskeletal: no edema c/c  Data Reviewed: Basic Metabolic Panel:  Recent Labs Lab 05/14/13 1315 05/15/13 0536 05/16/13 0719 05/17/13 0525 05/18/13 0626  NA 139 141 140 138 138  K 3.1* 3.3* 4.4 3.7 3.6  CL 100 103 105 103 99  CO2 28 28 26 27 29   GLUCOSE 127* 130* 121* 102* 98  BUN 5* 6 6 9 8   CREATININE 0.68 0.59 0.62 0.67 0.70  CALCIUM 9.3 8.8 8.7 8.8 8.9  MG 1.9  --   --   --   --    Liver Function Tests:  Recent  Labs Lab 05/12/13 0606 05/14/13 1315  AST 24 16  ALT 29 24  ALKPHOS 44 55  BILITOT 0.2* 0.4  PROT 5.1* 6.4  ALBUMIN 3.0* 3.8   No results found for this basename: LIPASE, AMYLASE,  in the last 168 hours No results found for this basename: AMMONIA,  in the last 168 hours CBC:  Recent Labs Lab 05/14/13 1315 05/15/13 0536 05/16/13 0719 05/17/13 0525 05/18/13 0626  WBC 12.0* 16.1* 15.0* 12.1* 12.2*  NEUTROABS 10.0*  --   --   --   --   HGB 13.7 12.0 12.1 12.2 13.6  HCT 39.0 34.1* 34.9* 35.7* 39.4  MCV 93.5 94.2 96.4 96.2 95.2  PLT 265 247 PLATELET CLUMPS NOTED ON SMEAR, COUNT APPEARS ADEQUATE 202 236   Cardiac Enzymes: No results found for this basename: CKTOTAL, CKMB, CKMBINDEX, TROPONINI,  in the last 168 hours BNP (last 3 results) No results found for this basename: PROBNP,  in the last 8760 hours CBG: No results found for this basename: GLUCAP,  in the last 168 hours  Recent Results (from the past 240 hour(s))  CULTURE, BLOOD (ROUTINE X 2)     Status: None   Collection Time    05/10/13  9:10 PM      Result Value Range Status   Specimen Description BLOOD LEFT ANTECUBITAL   Final   Special Requests BOTTLES DRAWN AEROBIC AND  ANAEROBIC 6CC   Final   Culture NO GROWTH 6 DAYS   Final   Report Status 05/16/2013 FINAL   Final  CULTURE, BLOOD (ROUTINE X 2)     Status: None   Collection Time    05/10/13  9:10 PM      Result Value Range Status   Specimen Description BLOOD LEFT HAND   Final   Special Requests BOTTLES DRAWN AEROBIC AND ANAEROBIC 6CC   Final   Culture NO GROWTH 6 DAYS   Final   Report Status 05/16/2013 FINAL   Final  MRSA PCR SCREENING     Status: None   Collection Time    05/10/13 11:35 PM      Result Value Range Status   MRSA by PCR NEGATIVE  NEGATIVE Final   Comment:            The GeneXpert MRSA Assay (FDA     approved for NASAL specimens     only), is one component of a     comprehensive MRSA colonization     surveillance program. It is not      intended to diagnose MRSA     infection nor to guide or     monitor treatment for     MRSA infections.  CLOSTRIDIUM DIFFICILE BY PCR     Status: Abnormal   Collection Time    05/12/13  4:00 PM      Result Value Range Status   C difficile by pcr POSITIVE (*) NEGATIVE Final   Comment: CRITICAL RESULT CALLED TO, READ BACK BY AND VERIFIED WITH:     FORTE,L. AT 1825 ON 05/12/2013 BY BAUGHAM,M.  URINE CULTURE     Status: None   Collection Time    05/14/13  3:15 PM      Result Value Range Status   Specimen Description URINE, CATHETERIZED   Final   Special Requests NONE   Final   Culture  Setup Time     Final   Value: 05/14/2013 22:56     Performed at [facility] Count     Final   Value: 20,OOO COLONIES/ML     Performed at [facility]   Culture     Final   Value: STAPHYLOCOCCUS SPECIES (COAGULASE NEGATIVE)     Note: RIFAMPIN AND GENTAMICIN SHOULD NOT BE USED AS SINGLE DRUGS FOR TREATMENT OF STAPH INFECTIONS.     Performed at [facility]   Report Status 05/17/2013 FINAL   Final   Organism ID, Bacteria STAPHYLOCOCCUS SPECIES (COAGULASE NEGATIVE)   Final     Studies: No results found.  Scheduled Meds: . famotidine (PEPCID) IV  20 mg Intravenous Q24H  . vancomycin  125 mg Oral Q6H   Continuous Infusions: . sodium chloride 75 mL/hr at 05/17/13 2340    Principal Problem:   Enteritis due to Clostridium difficile Active Problems:   Nausea and vomiting   Dehydration   Hypokalemia   Abdominal pain, generalized   Tobacco abuse   Leukocytosis   Lung nodule    Time spent:    [facility]  [facility] Pager [phone]. If 7PM-7AM, please contact night-coverage at [email or web address], password [facility] 05/18/2013, 1:53 PM  LOS: 4 days

## 2013-05-19 ENCOUNTER — Inpatient Hospital Stay (HOSPITAL_COMMUNITY): Payer: Medicaid Other

## 2013-05-19 DIAGNOSIS — D72829 Elevated white blood cell count, unspecified: Secondary | ICD-10-CM

## 2013-05-19 LAB — CBC
MCH: 32.3 pg (ref 26.0–34.0)
MCV: 95.5 fL (ref 78.0–100.0)
Platelets: 255 10*3/uL (ref 150–400)
RBC: 3.99 MIL/uL (ref 3.87–5.11)

## 2013-05-19 LAB — BASIC METABOLIC PANEL
CO2: 28 mEq/L (ref 19–32)
Calcium: 8.8 mg/dL (ref 8.4–10.5)
Chloride: 102 mEq/L (ref 96–112)
Creatinine, Ser: 0.8 mg/dL (ref 0.50–1.10)
Glucose, Bld: 97 mg/dL (ref 70–99)
Sodium: 139 mEq/L (ref 135–145)

## 2013-05-19 MED ORDER — LIDOCAINE VISCOUS 2 % MT SOLN: 7.5000 mL | Freq: Four times a day (QID) | OROMUCOSAL | Status: DC | PRN

## 2013-05-19 MED ORDER — ONDANSETRON HCL 4 MG/2ML IJ SOLN
INTRAMUSCULAR | Status: AC
  Filled 2013-05-19: qty 4

## 2013-05-19 MED ORDER — ONDANSETRON HCL 4 MG/2ML IJ SOLN
INTRAMUSCULAR | Status: AC
  Filled 2013-05-19: qty 2

## 2013-05-19 MED ORDER — SACCHAROMYCES BOULARDII 250 MG PO CAPS
250.0000 mg | ORAL_CAPSULE | Freq: Two times a day (BID) | ORAL | Status: DC
  Administered 2013-05-19 – 2013-05-20 (×2): 250 mg via ORAL
  Filled 2013-05-19 (×3): qty 1

## 2013-05-19 MED ORDER — FAMOTIDINE 20 MG PO TABS
20.0000 mg | ORAL_TABLET | Freq: Every day | ORAL | Status: DC
  Administered 2013-05-20: 20 mg via ORAL
  Filled 2013-05-19: qty 1

## 2013-05-19 MED ORDER — MAGIC MOUTHWASH W/LIDOCAINE: 15.0000 mL | Freq: Four times a day (QID) | ORAL | Status: DC | PRN

## 2013-05-19 MED ORDER — MAGIC MOUTHWASH: 7.5000 mL | Freq: Four times a day (QID) | ORAL | Status: DC | PRN

## 2013-05-19 NOTE — Progress Notes (Signed)
The patient is receiving Pepcid by the intravenous route.  Based on criteria approved by the Pharmacy and Therapeutics Committee and the Medical Executive Committee, the medication is being converted to the equivalent oral dose form.  These criteria include: -No Active GI bleeding -Able to tolerate diet of full liquids (or better) or tube feeding OR able to tolerate other medications by the oral or enteral route  If you have any questions about this conversion, please contact the Pharmacy Department (ext [phone]).  Thank you.  Wayland Denis, [facility] 05/19/2013 12:42 PM

## 2013-05-19 NOTE — Discharge Summary (Signed)
Physician Discharge Summary  Tonya Stewart:[id number] DOB: 1980/10/26 DOA: 05/14/2013  PCP: Colette Ribas, MD  Admit date: 05/14/2013 Discharge date: 05/20/2013  Time spent: 35 minutes  Recommendations for Outpatient Follow-up:  1. Take your oral antibiotic as directed 2. Follow up with PCP in 1-2 weeks  Discharge Diagnoses:  Principal Problem:   Enteritis due to Clostridium difficile Active Problems:   Nausea and vomiting   Dehydration   Hypokalemia   Abdominal pain, generalized   Tobacco abuse   Leukocytosis   Lung nodule  Discharge Condition: Improved  Diet recommendation: Regular  Filed Weights   05/14/13 1753  Weight: 84.1 kg (185 lb 6.5 oz)   History of present illness:  Tonya Stewart is a 32 y.o. female  With hx of tobacco abuse recently discharged 1 day PTA after being hospitalized for C Diff colitis who presents to ED with several hour history intractable nausea, emesis, diarrhea, generalized weakness, diffuse abdominal pain worse in the lower quadrants bilaterally. Patient does endorse some chest burning and tightness with emesis as well as subjective fevers, chills, diaphoresis, and some shortness of breath.  Patient states unable to afford Oral vancomycin as it was around $200 and she has no insurance.  Patient was seen in the emergency room comprehensive metabolic profile obtained on a potassium of 3.1 glucose of 127 otherwise was within normal limits. CBC had a white count of 12.0 with a left shift.  We will consulted to admit the patient for intractable nausea vomiting diarrhea and C. difficile colitis.  Hospital Course:  1. Cdiff Colitis -incomplete treatment as patient was reportedly unable to afford PO Vanc on recent discharge -Pt had been tolerating oral vancomycin when no further emesis in the hospital. - Diet was successfully advanced -CM consulted for med assistance - pt should be able to afford Vancocin, for $3 (not generic PO  vancomycin) - By the day of hospital discharge, the patient reports feeling markedly improved and able to tolerate regular PO 2. Hypokalemia  -from GI losses. Repleted  3. Tobacco abuse  -counseled, declines nicotine patch  4. Mild hyperglycemia  -Hbac 5.3.   Discharge Exam: Filed Vitals:   05/19/13 0529 05/19/13 1450 05/19/13 2143 05/20/13 0453  BP: 147/81 150/76 132/72 110/59  Pulse: 55 61 57 56  Temp: 98 F (36.7 C) 99 F (37.2 C) 98.8 F (37.1 C) 98.7 F (37.1 C)  TempSrc:   Oral Oral  Resp: 18 18 18 18   Height:      Weight:      SpO2: 100% 100% 100% 100%    General: Awake, in nad Cardiovascular: regular, s1, s2 Respiratory: normal resp effort, no wheezing  Discharge Instructions     Medication List         famotidine 20 MG tablet  Commonly known as:  PEPCID  Take 1 tablet (20 mg total) by mouth daily.     oxyCODONE-acetaminophen 5-325 MG per tablet  Commonly known as:  PERCOCET/ROXICET  Take 1 tablet by mouth every 4 (four) hours as needed for pain.     oxyCODONE-acetaminophen 5-325 MG per tablet  Commonly known as:  PERCOCET/ROXICET  Take 1 tablet by mouth every 4 (four) hours as needed for pain.     promethazine 25 MG tablet  Commonly known as:  PHENERGAN  Take 1 tablet (25 mg total) by mouth every 6 (six) hours as needed for nausea.     vancomycin 50 mg/mL oral solution  Commonly known as:  [facility]  Take 2.5 mLs (125 mg total) by mouth every 6 (six) hours. For 14 days       Allergies  Allergen Reactions  . Flagyl [Metronidazole] Hives  . Hydrocodone Itching and Nausea And Vomiting   Follow-up Information   Follow up with Colette Ribas, MD In 1 week.   Specialty:  Family Medicine   Contact information:   [address] [place] [address] [phone]        The results of significant diagnostics from this hospitalization (including imaging, microbiology, ancillary and laboratory) are listed below for  reference.    Significant Diagnostic Studies: Ct Abdomen Pelvis W Contrast  05/10/2013   CLINICAL DATA:  Sharp abdominal pain and nausea, vomiting, diarrhea for 4 days. History of E coli pyelonephritis in May.  EXAM: CT ABDOMEN AND PELVIS WITH CONTRAST  TECHNIQUE: Multidetector CT imaging of the abdomen and pelvis was performed using the standard protocol following bolus administration of intravenous contrast.  CONTRAST:  50mL OMNIPAQUE IOHEXOL 300 MG/ML SOLN, OMNIPAQUE IOHEXOL 300 MG/ML SOLN  COMPARISON:  12/11/2012  FINDINGS: 3 mm nodule in the right lung base is stable since previous study. This is likely benign in a low risk patient.  The liver, spleen, gallbladder, pancreas, adrenal glands, kidneys, abdominal aorta, inferior vena cava, and retroperitoneal lymph nodes are unremarkable. Gastric wall is not thickened. Small bowel and colon are decompressed. The no free air or free fluid in the abdomen.  Pelvis: The uterus and ovaries are not enlarged. Bladder wall is not thickened. No free or loculated pelvic fluid collections. Appendix is normal. No evidence of diverticulitis. No significant pelvic lymphadenopathy. No destructive bone lesions appreciated.  IMPRESSION: No acute process demonstrated in the abdomen or pelvis. No significant change since previous study.   Electronically Signed   By: Burman Nieves M.D.   On: 05/10/2013 22:51   Dg Abd Acute W/chest  05/14/2013   CLINICAL DATA:  Nausea, vomiting, abdominal pain  EXAM: ACUTE ABDOMEN SERIES (ABDOMEN 2 VIEW & CHEST 1 VIEW)  COMPARISON:  05/10/2013, 01/22/2011  FINDINGS: There is no evidence of dilated bowel loops or free intraperitoneal air. No radiopaque calculi or other significant radiographic abnormality is seen. Heart size and mediastinal contours are within normal limits. Both lungs are clear.  IMPRESSION: Negative abdominal radiographs.  No acute cardiopulmonary disease.   Electronically Signed   By: Ruel Favors M.D.   On:  05/14/2013 16:34    Microbiology: Recent Results (from the past 240 hour(s))  CULTURE, BLOOD (ROUTINE X 2)     Status: None   Collection Time    05/10/13  9:10 PM      Result Value Range Status   Specimen Description BLOOD LEFT ANTECUBITAL   Final   Special Requests BOTTLES DRAWN AEROBIC AND ANAEROBIC 6CC   Final   Culture NO GROWTH 6 DAYS   Final   Report Status 05/16/2013 FINAL   Final  CULTURE, BLOOD (ROUTINE X 2)     Status: None   Collection Time    05/10/13  9:10 PM      Result Value Range Status   Specimen Description BLOOD LEFT HAND   Final   Special Requests BOTTLES DRAWN AEROBIC AND ANAEROBIC 6CC   Final   Culture NO GROWTH 6 DAYS   Final   Report Status 05/16/2013 FINAL   Final  MRSA PCR SCREENING     Status: None   Collection Time    05/10/13 11:35  PM      Result Value Range Status   MRSA by PCR NEGATIVE  NEGATIVE Final   Comment:            The GeneXpert MRSA Assay (FDA     approved for NASAL specimens     only), is one component of a     comprehensive MRSA colonization     surveillance program. It is not     intended to diagnose MRSA     infection nor to guide or     monitor treatment for     MRSA infections.  CLOSTRIDIUM DIFFICILE BY PCR     Status: Abnormal   Collection Time    05/12/13  4:00 PM      Result Value Range Status   C difficile by pcr POSITIVE (*) NEGATIVE Final   Comment: CRITICAL RESULT CALLED TO, READ BACK BY AND VERIFIED WITH:     FORTE,L. AT 1825 ON 05/12/2013 BY BAUGHAM,M.  URINE CULTURE     Status: None   Collection Time    05/14/13  3:15 PM      Result Value Range Status   Specimen Description URINE, CATHETERIZED   Final   Special Requests NONE   Final   Culture  Setup Time     Final   Value: 05/14/2013 22:56     Performed at [facility] Count     Final   Value: 20,OOO COLONIES/ML     Performed at [facility]   Culture     Final   Value: STAPHYLOCOCCUS SPECIES (COAGULASE NEGATIVE)     Note:  RIFAMPIN AND GENTAMICIN SHOULD NOT BE USED AS SINGLE DRUGS FOR TREATMENT OF STAPH INFECTIONS.     Performed at [facility]   Report Status 05/17/2013 FINAL   Final   Organism ID, Bacteria STAPHYLOCOCCUS SPECIES (COAGULASE NEGATIVE)   Final     Labs: Basic Metabolic Panel:  Recent Labs Lab 05/14/13 1315  05/16/13 0719 05/17/13 0525 05/18/13 0626 05/19/13 0549 05/20/13 0500  NA 139  < > 140 138 138 139 139  K 3.1*  < > 4.4 3.7 3.6 3.4* 2.9*  CL 100  < > 105 103 99 102 101  CO2 28  < > 26 27 29 28 29   GLUCOSE 127*  < > 121* 102* 98 97 99  BUN 5*  < > 6 9 8 7  4*  CREATININE 0.68  < > 0.62 0.67 0.70 0.80 0.71  CALCIUM 9.3  < > 8.7 8.8 8.9 8.8 8.7  MG 1.9  --   --   --   --   --  2.2  < > = values in this interval not displayed. Liver Function Tests:  Recent Labs Lab 05/14/13 1315 05/20/13 0500  AST 16 12  ALT 24 34  ALKPHOS 55 49  BILITOT 0.4 0.3  PROT 6.4 5.6*  ALBUMIN 3.8 3.2*   No results found for this basename: LIPASE, AMYLASE,  in the last 168 hours No results found for this basename: AMMONIA,  in the last 168 hours CBC:  Recent Labs Lab 05/14/13 1315  05/16/13 0719 05/17/13 0525 05/18/13 0626 05/19/13 0549 05/20/13 0500  WBC 12.0*  < > 15.0* 12.1* 12.2* 9.3 11.4*  NEUTROABS 10.0*  --   --   --   --   --  6.9  HGB 13.7  < > 12.1 12.2 13.6 12.9 12.2  HCT 39.0  < > 34.9* 35.7*  39.4 38.1 35.1*  MCV 93.5  < > 96.4 96.2 95.2 95.5 94.1  PLT 265  < > PLATELET CLUMPS NOTED ON SMEAR, COUNT APPEARS ADEQUATE 202 236 255 262  < > = values in this interval not displayed. Cardiac Enzymes: No results found for this basename: CKTOTAL, CKMB, CKMBINDEX, TROPONINI,  in the last 168 hours BNP: BNP (last 3 results) No results found for this basename: PROBNP,  in the last 8760 hours CBG: No results found for this basename: GLUCAP,  in the last 168 hours   Signed:  Marchel Foote K  [facility] 05/20/2013, 11:03 AM

## 2013-05-19 NOTE — Progress Notes (Signed)
[facility] PROGRESS NOTE  Tonya Stewart MWU:[id number] DOB: 04/05/1981 DOA: 05/14/2013 PCP: Colette Ribas, MD  Assessment/Plan: 1. Cdiff Colitis -incomplete treatment, reportedly was unable to afford PO Vanc  -Currently tolerating oral vancomycin - Patient still with some nausea and vomiting - Will advance diet to full liquids. Continue antiemetics, IV fluids, supportive care, pain management.  -CM consulted for med assistance - can afford Vancocin - NOT generic vancomycin - Will cont probiotic - As pt is complaining of increased abd pain and bloating, will check abd xray to r/o SBO in the setting of cdiff colitis 2. Hypokalemia  -from GI losses. Repleted  -bmet in am  3. Tobacco abuse  -counseled, declines nicotine patch  4. Mild hyperglycemia  -Hbac 5.3.  Code Status: Full Family Communication: Pt in room (indicate person spoken with, relationship, and if by phone, the number) Disposition Plan: Penidng  Antibiotics:  PO vancomycin 05/17/13>>>  HPI/Subjective: Complains of worsening abd pain and "bloating." Still w/ nausea/vomiting and unable to tolerate PO at bedside.  Objective: Filed Vitals:   05/18/13 0551 05/18/13 1500 05/18/13 2230 05/19/13 0529  BP: 111/67 120/66 126/73 147/81  Pulse: 57 64 54 55  Temp: 98.2 F (36.8 C) 98.4 F (36.9 C) 98.9 F (37.2 C) 98 F (36.7 C)  TempSrc: Oral Oral    Resp: 18 18 18 18   Height:      Weight:      SpO2: 97% 96% 100% 100%    Intake/Output Summary (Last 24 hours) at 05/19/13 0935 Last data filed at 05/19/13 0848  Gross per 24 hour  Intake 6091.67 ml  Output      0 ml  Net 6091.67 ml   Filed Weights   05/14/13 1753  Weight: 84.1 kg (185 lb 6.5 oz)    Exam:   General:  Awake, in nad  Cardiovascular: regular, s1, s2  Respiratory: normal resp effort, no wheezing  Abdomen: decreased BS, diffusely tender  Musculoskeletal: perfused, no clubbing   Data Reviewed: Basic Metabolic  Panel:  Recent Labs Lab 05/14/13 1315 05/15/13 0536 05/16/13 0719 05/17/13 0525 05/18/13 0626 05/19/13 0549  NA 139 141 140 138 138 139  K 3.1* 3.3* 4.4 3.7 3.6 3.4*  CL 100 103 105 103 99 102  CO2 28 28 26 27 29 28   GLUCOSE 127* 130* 121* 102* 98 97  BUN 5* 6 6 9 8 7   CREATININE 0.68 0.59 0.62 0.67 0.70 0.80  CALCIUM 9.3 8.8 8.7 8.8 8.9 8.8  MG 1.9  --   --   --   --   --    Liver Function Tests:  Recent Labs Lab 05/14/13 1315  AST 16  ALT 24  ALKPHOS 55  BILITOT 0.4  PROT 6.4  ALBUMIN 3.8   No results found for this basename: LIPASE, AMYLASE,  in the last 168 hours No results found for this basename: AMMONIA,  in the last 168 hours CBC:  Recent Labs Lab 05/14/13 1315 05/15/13 0536 05/16/13 0719 05/17/13 0525 05/18/13 0626 05/19/13 0549  WBC 12.0* 16.1* 15.0* 12.1* 12.2* 9.3  NEUTROABS 10.0*  --   --   --   --   --   HGB 13.7 12.0 12.1 12.2 13.6 12.9  HCT 39.0 34.1* 34.9* 35.7* 39.4 38.1  MCV 93.5 94.2 96.4 96.2 95.2 95.5  PLT 265 247 PLATELET CLUMPS NOTED ON SMEAR, COUNT APPEARS ADEQUATE 202 236 255   Cardiac Enzymes: No results found for this basename: CKTOTAL, CKMB, CKMBINDEX, TROPONINI,  in the last 168 hours BNP (last 3 results) No results found for this basename: PROBNP,  in the last 8760 hours CBG: No results found for this basename: GLUCAP,  in the last 168 hours  Recent Results (from the past 240 hour(s))  CULTURE, BLOOD (ROUTINE X 2)     Status: None   Collection Time    05/10/13  9:10 PM      Result Value Range Status   Specimen Description BLOOD LEFT ANTECUBITAL   Final   Special Requests BOTTLES DRAWN AEROBIC AND ANAEROBIC 6CC   Final   Culture NO GROWTH 6 DAYS   Final   Report Status 05/16/2013 FINAL   Final  CULTURE, BLOOD (ROUTINE X 2)     Status: None   Collection Time    05/10/13  9:10 PM      Result Value Range Status   Specimen Description BLOOD LEFT HAND   Final   Special Requests BOTTLES DRAWN AEROBIC AND ANAEROBIC 6CC    Final   Culture NO GROWTH 6 DAYS   Final   Report Status 05/16/2013 FINAL   Final  MRSA PCR SCREENING     Status: None   Collection Time    05/10/13 11:35 PM      Result Value Range Status   MRSA by PCR NEGATIVE  NEGATIVE Final   Comment:            The GeneXpert MRSA Assay (FDA     approved for NASAL specimens     only), is one component of a     comprehensive MRSA colonization     surveillance program. It is not     intended to diagnose MRSA     infection nor to guide or     monitor treatment for     MRSA infections.  CLOSTRIDIUM DIFFICILE BY PCR     Status: Abnormal   Collection Time    05/12/13  4:00 PM      Result Value Range Status   C difficile by pcr POSITIVE (*) NEGATIVE Final   Comment: CRITICAL RESULT CALLED TO, READ BACK BY AND VERIFIED WITH:     FORTE,L. AT 1825 ON 05/12/2013 BY BAUGHAM,M.  URINE CULTURE     Status: None   Collection Time    05/14/13  3:15 PM      Result Value Range Status   Specimen Description URINE, CATHETERIZED   Final   Special Requests NONE   Final   Culture  Setup Time     Final   Value: 05/14/2013 22:56     Performed at [facility] Count     Final   Value: 20,OOO COLONIES/ML     Performed at [facility]   Culture     Final   Value: STAPHYLOCOCCUS SPECIES (COAGULASE NEGATIVE)     Note: RIFAMPIN AND GENTAMICIN SHOULD NOT BE USED AS SINGLE DRUGS FOR TREATMENT OF STAPH INFECTIONS.     Performed at [facility]   Report Status 05/17/2013 FINAL   Final   Organism ID, Bacteria STAPHYLOCOCCUS SPECIES (COAGULASE NEGATIVE)   Final     Studies: No results found.  Scheduled Meds: . famotidine (PEPCID) IV  20 mg Intravenous Q24H  . vancomycin  125 mg Oral Q6H   Continuous Infusions: . sodium chloride 75 mL/hr at 05/19/13 1610    Principal Problem:   Enteritis due to Clostridium difficile Active Problems:   Nausea and vomiting   Dehydration  Hypokalemia   Abdominal pain, generalized   Tobacco  abuse   Leukocytosis   Lung nodule    Time spent:    Jarrett Albor K  [facility] Pager [phone]. If 7PM-7AM, please contact night-coverage at [email or web address], password [facility] 05/19/2013, 9:35 AM  LOS: 5 days

## 2013-05-19 NOTE — Care Management Note (Signed)
    Page 1 of 1   05/19/2013     11:34:42 AM   CARE MANAGEMENT NOTE 05/19/2013  Patient:  Tonya, Stewart   Account Number:  [id number]  Date Initiated:  05/19/2013  Documentation initiated by:  Rosemary Holms  Subjective/Objective Assessment:   Pt admitted from home where she lives with spouse and children. When dc'd stated she could not afford Vancomycin with Medicaid. Pt not eligible for MATCH program due to [facility] payor. Per CM pt stated she would be back due to cost. Pt was     Action/Plan:   readmitted. CM to research alternative options   Anticipated DC Date:  05/19/2013   Anticipated DC Plan:  HOME/SELF CARE      DC Planning Services  CM consult      Choice offered to / List presented to:             Status of service:  Completed, signed off Medicare Important Message given?   (If response is "NO", the following Medicare IM given date fields will be blank) Date Medicare IM given:   Date Additional Medicare IM given:    Discharge Disposition:    Per UR Regulation:    If discussed at Long Length of Stay Meetings, dates discussed:    Comments:  10.29/14 Tonya Willbanks RN BNS CM Pt unable to afford Vancomycin. Per Medicaid, the generic Vancomycin was not preferred. The brand name, Robet Leu is covered by Medicaid as preferred. MD notified that on DC please write Rx for brand name. CM asked that the RN also emphasize that proper hygiene will prevent the spread of infection to her family.

## 2013-05-20 DIAGNOSIS — F17200 Nicotine dependence, unspecified, uncomplicated: Secondary | ICD-10-CM

## 2013-05-20 DIAGNOSIS — R197 Diarrhea, unspecified: Secondary | ICD-10-CM

## 2013-05-20 LAB — COMPREHENSIVE METABOLIC PANEL
ALT: 34 U/L (ref 0–35)
BUN: 4 mg/dL — ABNORMAL LOW (ref 6–23)
CO2: 29 mEq/L (ref 19–32)
Calcium: 8.7 mg/dL (ref 8.4–10.5)
Creatinine, Ser: 0.71 mg/dL (ref 0.50–1.10)
GFR calc Af Amer: >90 mL/min (ref >90)
GFR calc non Af Amer: >90 mL/min (ref >90)
Glucose, Bld: 99 mg/dL (ref 70–99)
Potassium: 2.9 mEq/L — ABNORMAL LOW (ref 3.5–5.1)
Sodium: 139 mEq/L (ref 135–145)
Total Protein: 5.6 g/dL — ABNORMAL LOW (ref 6.0–8.3)

## 2013-05-20 LAB — CBC WITH DIFFERENTIAL/PLATELET
Basophils Relative: 0 % (ref 0–1)
Eosinophils Absolute: 0.3 10*3/uL (ref 0.0–0.7)
Eosinophils Relative: 3 % (ref 0–5)
Lymphocytes Relative: 24 % (ref 12–46)
Lymphs Abs: 2.8 10*3/uL (ref 0.7–4.0)
MCH: 32.7 pg (ref 26.0–34.0)
MCHC: 34.8 g/dL (ref 30.0–36.0)
MCV: 94.1 fL (ref 78.0–100.0)
Monocytes Absolute: 1.4 10*3/uL — ABNORMAL HIGH (ref 0.1–1.0)
Platelets: 262 10*3/uL (ref 150–400)
RBC: 3.73 MIL/uL — ABNORMAL LOW (ref 3.87–5.11)

## 2013-05-20 LAB — MAGNESIUM: Magnesium: 2.2 mg/dL (ref 1.5–2.5)

## 2013-05-20 MED ORDER — SACCHAROMYCES BOULARDII 250 MG PO CAPS: 250.0000 mg | ORAL_CAPSULE | Freq: Two times a day (BID) | ORAL | Status: DC

## 2013-05-20 MED ORDER — OXYCODONE-ACETAMINOPHEN 5-325 MG PO TABS: 1.0000 | ORAL_TABLET | ORAL | Status: DC | PRN

## 2013-05-20 MED ORDER — FAMOTIDINE 20 MG PO TABS: 20.0000 mg | ORAL_TABLET | Freq: Every day | ORAL | Status: DC

## 2013-05-20 MED ORDER — ONDANSETRON HCL 4 MG PO TABS: 4.0000 mg | ORAL_TABLET | Freq: Three times a day (TID) | ORAL | Status: DC | PRN

## 2013-05-20 MED ORDER — ONDANSETRON HCL 4 MG/2ML IJ SOLN
INTRAMUSCULAR | Status: AC
  Filled 2013-05-20: qty 4

## 2013-05-20 MED ORDER — VANCOMYCIN 50 MG/ML ORAL SOLUTION: 125.0000 mg | Freq: Four times a day (QID) | ORAL | Status: DC

## 2013-05-20 MED ORDER — PROMETHAZINE HCL 12.5 MG PO TABS
25.0000 mg | ORAL_TABLET | Freq: Four times a day (QID) | ORAL | Status: DC | PRN
  Administered 2013-05-20: 25 mg via ORAL
  Filled 2013-05-20: qty 2

## 2013-05-20 MED ORDER — POTASSIUM CHLORIDE 10 MEQ/100ML IV SOLN
10.0000 meq | INTRAVENOUS | Status: DC
  Filled 2013-05-20: qty 100

## 2013-05-20 MED ORDER — POTASSIUM CHLORIDE CRYS ER 20 MEQ PO TBCR
40.0000 meq | EXTENDED_RELEASE_TABLET | Freq: Two times a day (BID) | ORAL | Status: DC
  Administered 2013-05-20: 40 meq via ORAL
  Filled 2013-05-20: qty 2

## 2013-05-20 MED ORDER — OXYCODONE-ACETAMINOPHEN 5-325 MG PO TABS
1.0000 | ORAL_TABLET | ORAL | Status: DC | PRN
  Administered 2013-05-20: 1 via ORAL
  Filled 2013-05-20: qty 1

## 2013-05-20 NOTE — Progress Notes (Signed)
Pt verbalizes understanding of d/c instructions, medications, and follow up appts. No questions at this time. IV was d/c earlier today without complications. Pt d/c via wheelchair accompanied by NT and her family member. Sheryn Bison

## 2015-04-01 IMAGING — CT CT ABD-PELV W/ CM
2 of 3 series · 17 of 46 positions shown, 19 images · IV contrast (Omnipaque 300)
Comparison: 12/22/2010.

CLINICAL DATA: Abdominal pain with nausea and fever.

CT ABDOMEN AND PELVIS WITH CONTRAST
TECHNIQUE: Multidetector CT imaging of the abdomen and pelvis was
performed following the standard protocol during bolus
administration of intravenous contrast.
Contrast: 100mL OMNIPAQUE IOHEXOL 300 MG/ML  SOLN

[Series 2: abd_pel_with 5.0 b40f · axial · 0.79mm/px · z∈[-494,-89]mm · 14 of 93 slices shown, 16 images]
[im 6/93  soft-tissue]
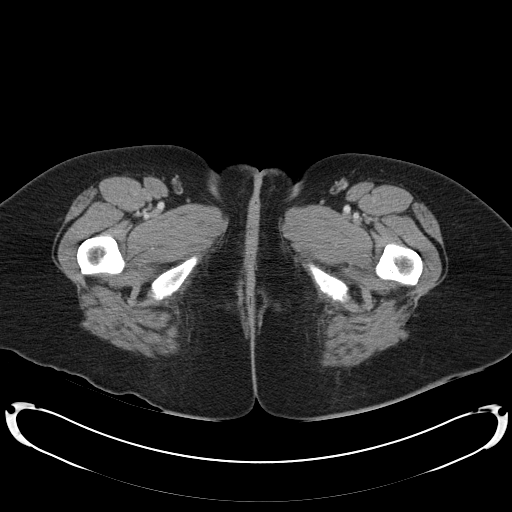
[im 6/93  bone]
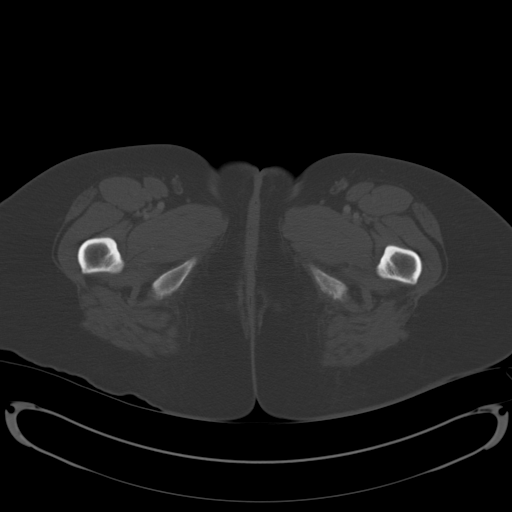
[im 12/93  soft-tissue]
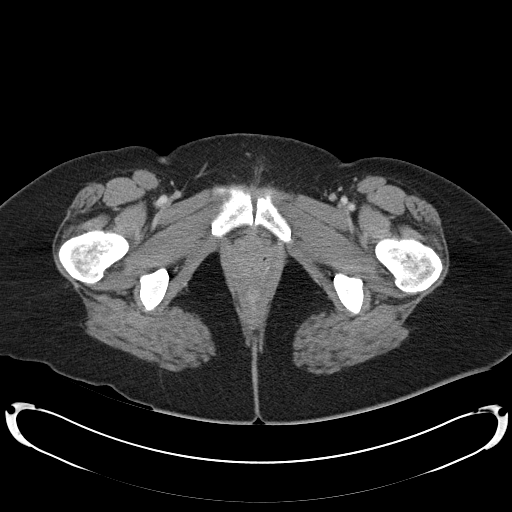
[im 18/93  soft-tissue]
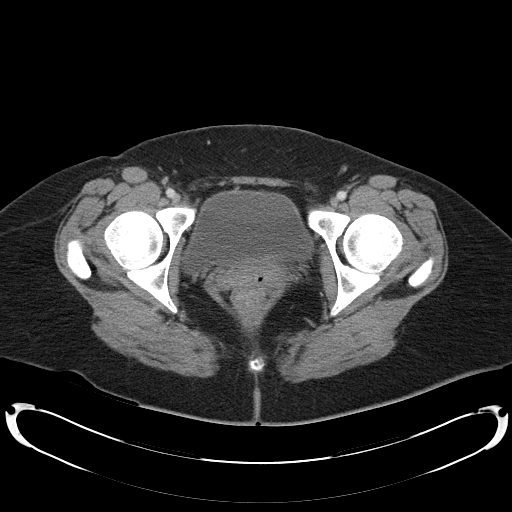
[im 24/93  soft-tissue]
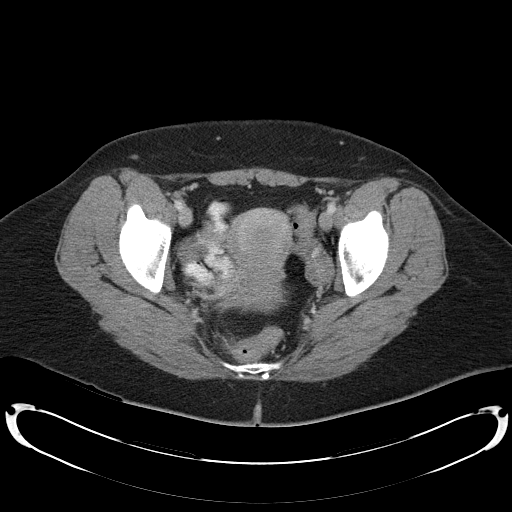
[im 30/93  soft-tissue]
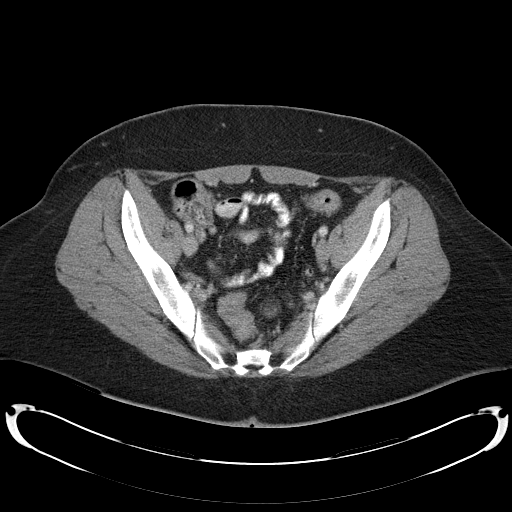
[im 36/93  soft-tissue]
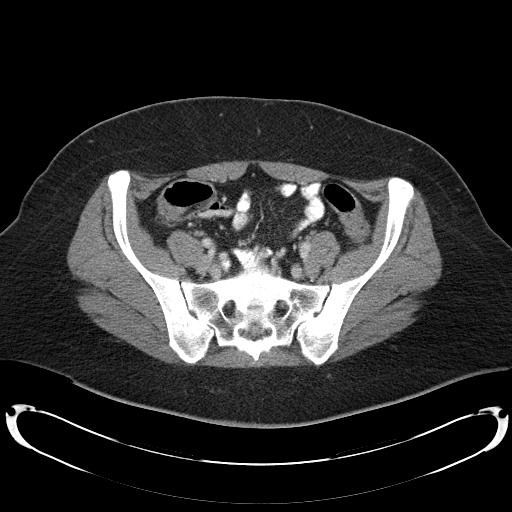
[im 42/93  soft-tissue]
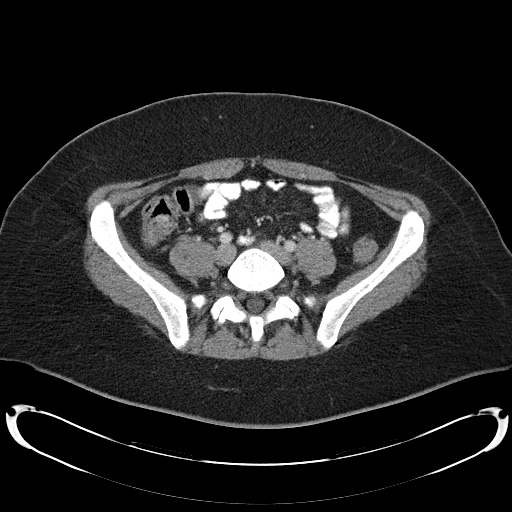
[im 51/93  soft-tissue]
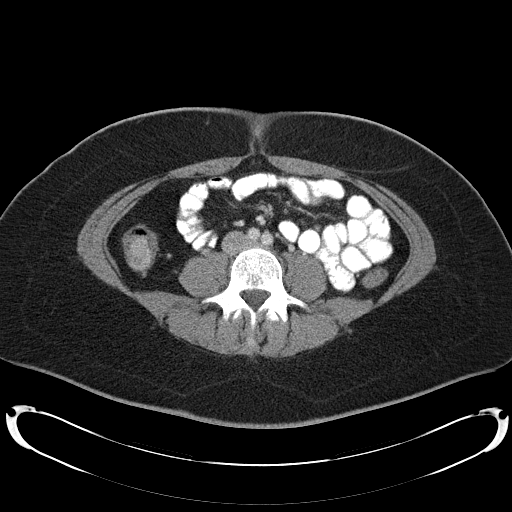
[im 57/93  soft-tissue]
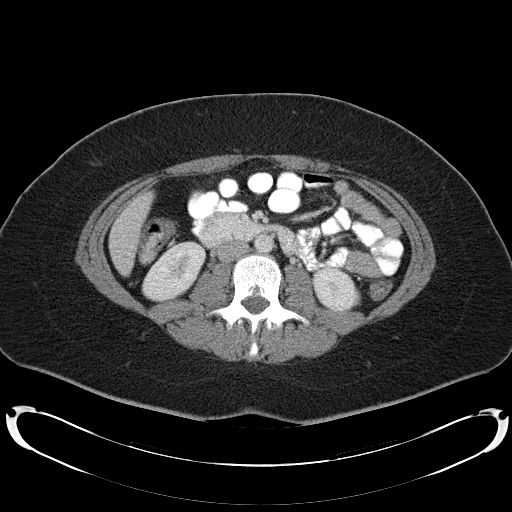
[im 57/93  bone]
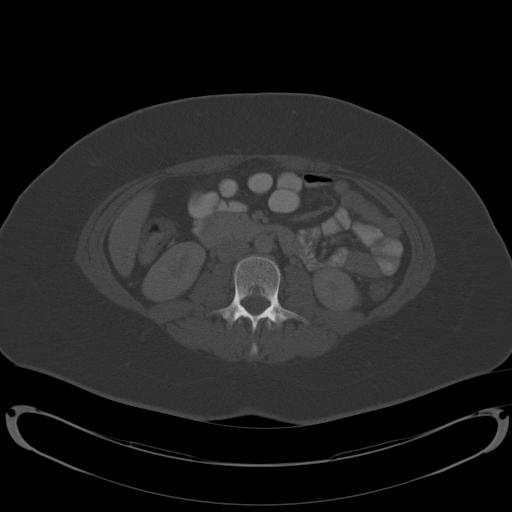
[im 63/93  soft-tissue]
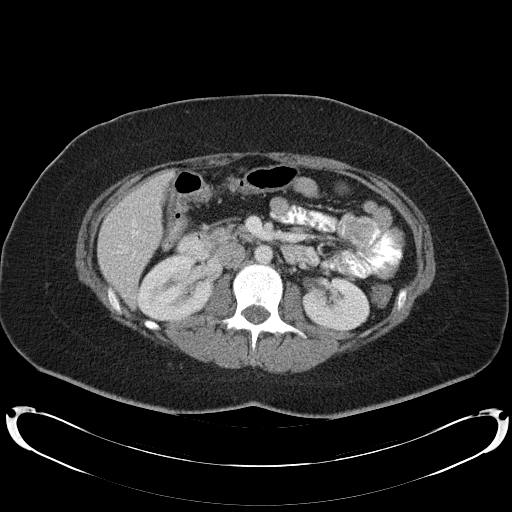
[im 69/93  soft-tissue]
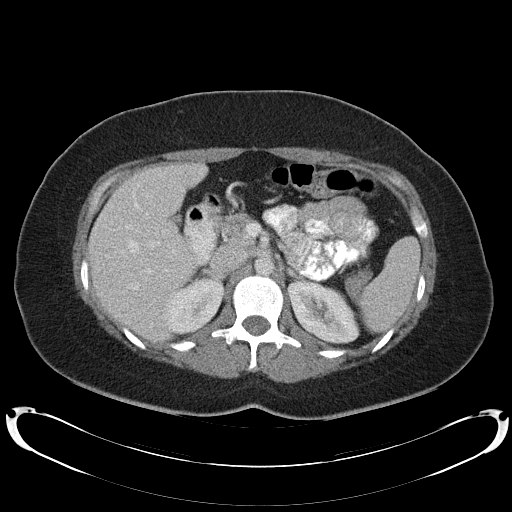
[im 75/93  soft-tissue]
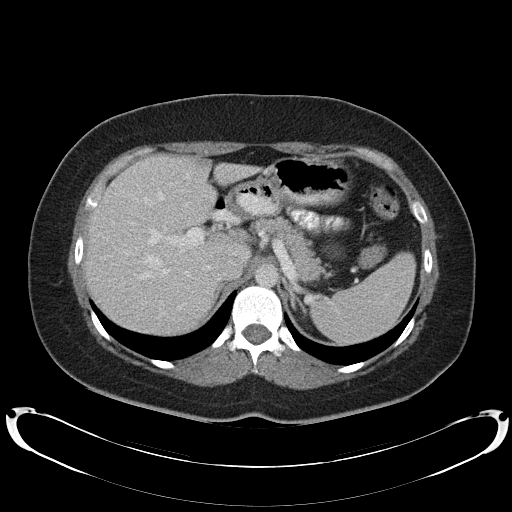
[im 81/93  soft-tissue]
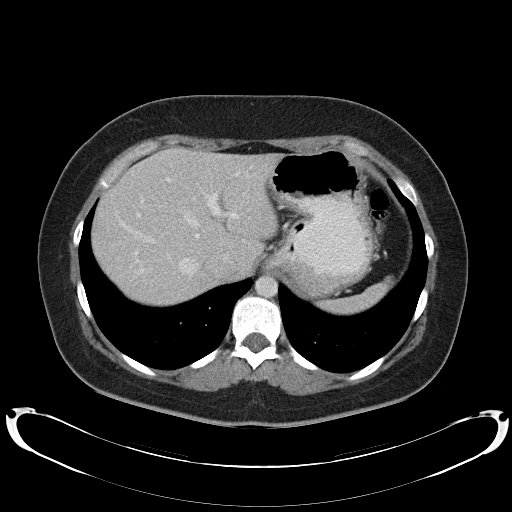
[im 87/93  soft-tissue]
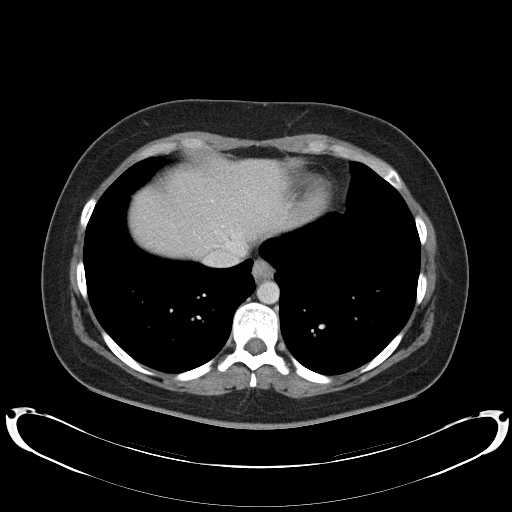

[Series 4: abd_pel_with 3.0 spo cor · coronal · 0.69mm/px · 3 of 77 slices shown]
[im 26/77  soft-tissue]
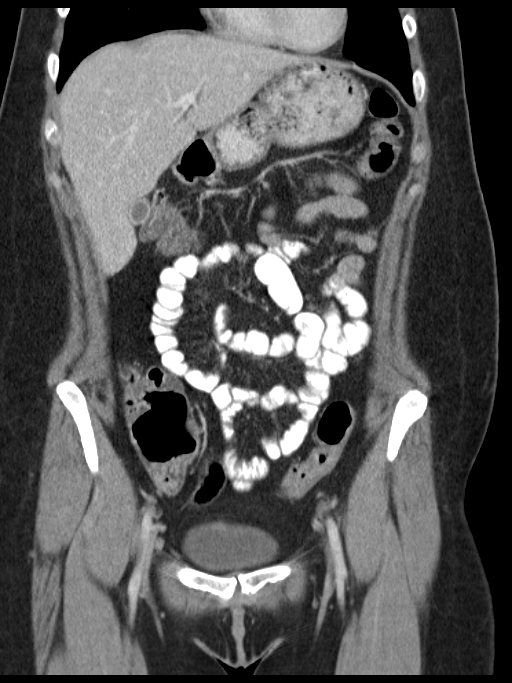
[im 34/77  soft-tissue]
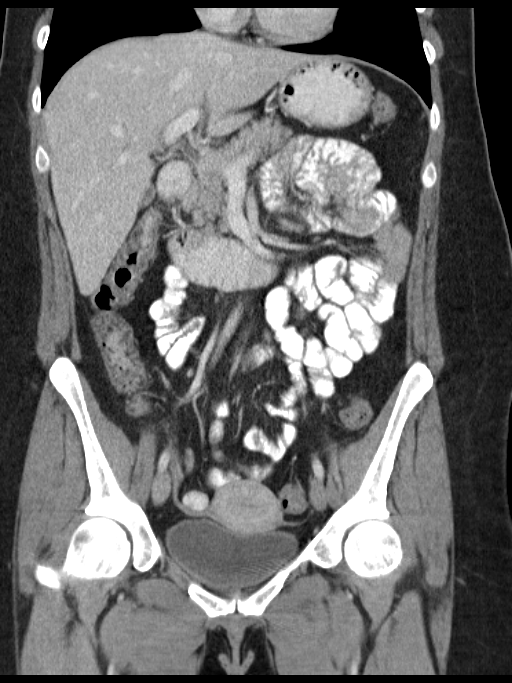
[im 43/77  soft-tissue]
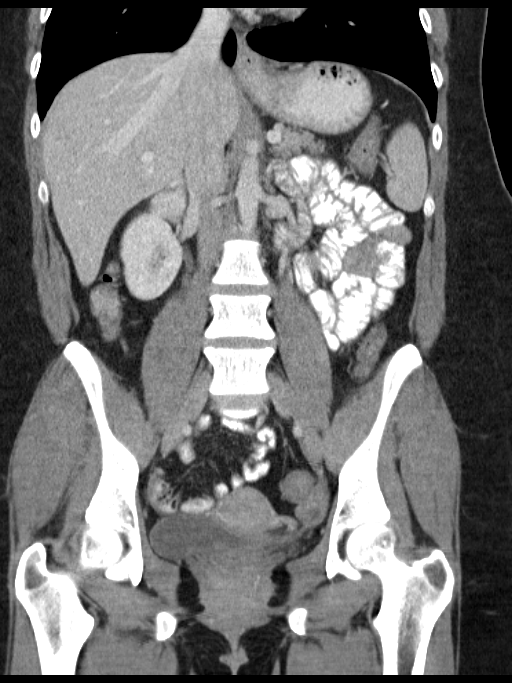

[17 of 46 positions shown; findings below may reference images not displayed]

FINDINGS: The liver, spleen, pancreas, adrenal glands, kidneys, and
biliary tree are normal except for a small focal area of fatty
infiltration in the left lobe of the liver adjacent to the
falciform ligament, unchanged.  This is of no clinical
significance.

The bowel is normal including the terminal ileum and appendix.
Uterus and ovaries are normal.  No free air or free fluid.  No
osseous abnormality.
IMPRESSION: Benign-appearing abdomen and pelvis.

## 2015-05-18 ENCOUNTER — Encounter (HOSPITAL_COMMUNITY): Payer: Self-pay | Admitting: Emergency Medicine

## 2015-05-18 ENCOUNTER — Emergency Department (HOSPITAL_COMMUNITY)
Admission: EM | Admit: 2015-05-18 | Discharge: 2015-05-18 | Disposition: A | Discharge status: Home / Self Care | Payer: Medicaid Other | Attending: Emergency Medicine | Admitting: Emergency Medicine

## 2015-05-18 DIAGNOSIS — Z720 Tobacco use: Secondary | ICD-10-CM | POA: Insufficient documentation

## 2015-05-18 DIAGNOSIS — R000 Tachycardia, unspecified: Secondary | ICD-10-CM | POA: Diagnosis not present

## 2015-05-18 DIAGNOSIS — N390 Urinary tract infection, site not specified: Secondary | ICD-10-CM

## 2015-05-18 DIAGNOSIS — R197 Diarrhea, unspecified: Secondary | ICD-10-CM | POA: Diagnosis not present

## 2015-05-18 DIAGNOSIS — Z8619 Personal history of other infectious and parasitic diseases: Secondary | ICD-10-CM | POA: Insufficient documentation

## 2015-05-18 DIAGNOSIS — Z3202 Encounter for pregnancy test, result negative: Secondary | ICD-10-CM | POA: Diagnosis not present

## 2015-05-18 DIAGNOSIS — Z8679 Personal history of other diseases of the circulatory system: Secondary | ICD-10-CM | POA: Insufficient documentation

## 2015-05-18 DIAGNOSIS — R51 Headache: Secondary | ICD-10-CM | POA: Diagnosis not present

## 2015-05-18 DIAGNOSIS — R110 Nausea: Secondary | ICD-10-CM | POA: Diagnosis present

## 2015-05-18 DIAGNOSIS — R52 Pain, unspecified: Secondary | ICD-10-CM

## 2015-05-18 HISTORY — DX: Enterocolitis due to Clostridium difficile, not specified as recurrent: A04.72

## 2015-05-18 LAB — CBC WITH DIFFERENTIAL/PLATELET
Basophils Absolute: 0 10*3/uL (ref 0.0–0.1)
Basophils Relative: 0 %
Eosinophils Absolute: 0.1 10*3/uL (ref 0.0–0.7)
Eosinophils Relative: 1 %
HCT: 39.1 % (ref 36.0–46.0)
Hemoglobin: 13.2 g/dL (ref 12.0–15.0)
Lymphocytes Relative: 11 %
Lymphs Abs: 1.4 10*3/uL (ref 0.7–4.0)
MCH: 33 pg (ref 26.0–34.0)
MCHC: 33.8 g/dL (ref 30.0–36.0)
MCV: 97.8 fL (ref 78.0–100.0)
Monocytes Absolute: 1.1 10*3/uL — ABNORMAL HIGH (ref 0.1–1.0)
Monocytes Relative: 8 %
Neutro Abs: 10.7 10*3/uL — ABNORMAL HIGH (ref 1.7–7.7)
Neutrophils Relative %: 80 %
Platelets: 254 10*3/uL (ref 150–400)
RBC: 4 MIL/uL (ref 3.87–5.11)
RDW: 12.5 % (ref 11.5–15.5)
WBC: 13.3 10*3/uL — ABNORMAL HIGH (ref 4.0–10.5)

## 2015-05-18 LAB — COMPREHENSIVE METABOLIC PANEL
ALT: 29 U/L (ref 14–54)
AST: 22 U/L (ref 15–41)
Albumin: 3.7 g/dL (ref 3.5–5.0)
Alkaline Phosphatase: 81 U/L (ref 38–126)
Anion gap: 7 (ref 5–15)
BUN: 7 mg/dL (ref 6–20)
CO2: 25 mmol/L (ref 22–32)
Calcium: 8.7 mg/dL — ABNORMAL LOW (ref 8.9–10.3)
Chloride: 104 mmol/L (ref 101–111)
Creatinine, Ser: 0.68 mg/dL (ref 0.44–1.00)
GFR calc Af Amer: >60 mL/min (ref >60)
GFR calc non Af Amer: >60 mL/min (ref >60)
Glucose, Bld: 113 mg/dL — ABNORMAL HIGH (ref 65–99)
Potassium: 3.9 mmol/L (ref 3.5–5.1)
Sodium: 136 mmol/L (ref 135–145)
Total Bilirubin: 0.4 mg/dL (ref 0.3–1.2)
Total Protein: 7.3 g/dL (ref 6.5–8.1)

## 2015-05-18 LAB — URINALYSIS, ROUTINE W REFLEX MICROSCOPIC
Bilirubin Urine: NEGATIVE
Glucose, UA: NEGATIVE mg/dL
Ketones, ur: NEGATIVE mg/dL
Nitrite: POSITIVE — AB
Protein, ur: 30 mg/dL — AB
Specific Gravity, Urine: 1.015 (ref 1.005–1.030)
Urobilinogen, UA: 1 mg/dL (ref 0.0–1.0)
pH: 6 (ref 5.0–8.0)

## 2015-05-18 LAB — URINE MICROSCOPIC-ADD ON

## 2015-05-18 LAB — PREGNANCY, URINE: Preg Test, Ur: NEGATIVE

## 2015-05-18 MED ORDER — KETOROLAC TROMETHAMINE 30 MG/ML IJ SOLN
15.0000 mg | Freq: Once | INTRAMUSCULAR | Status: AC
  Administered 2015-05-18: 15 mg via INTRAVENOUS
  Filled 2015-05-18: qty 1

## 2015-05-18 MED ORDER — PROCHLORPERAZINE EDISYLATE 5 MG/ML IJ SOLN
10.0000 mg | Freq: Once | INTRAMUSCULAR | Status: AC
  Administered 2015-05-18: 10 mg via INTRAVENOUS
  Filled 2015-05-18: qty 2

## 2015-05-18 MED ORDER — SODIUM CHLORIDE 0.9 % IV BOLUS (SEPSIS)
1000.0000 mL | Freq: Once | INTRAVENOUS | Status: AC
Start: 2015-05-18 — End: 2015-05-18
  Administered 2015-05-18: 1000 mL via INTRAVENOUS

## 2015-05-18 MED ORDER — TRAMADOL HCL 50 MG PO TABS: 50.0000 mg | ORAL_TABLET | Freq: Four times a day (QID) | ORAL | Status: AC | PRN

## 2015-05-18 MED ORDER — MORPHINE SULFATE (PF) 4 MG/ML IV SOLN
4.0000 mg | Freq: Once | INTRAVENOUS | Status: AC
  Administered 2015-05-18: 4 mg via INTRAVENOUS
  Filled 2015-05-18: qty 1

## 2015-05-18 MED ORDER — CEPHALEXIN 500 MG PO CAPS: 500.0000 mg | ORAL_CAPSULE | Freq: Four times a day (QID) | ORAL | Status: AC

## 2015-05-18 MED ORDER — DIPHENHYDRAMINE HCL 50 MG/ML IJ SOLN
12.5000 mg | Freq: Once | INTRAMUSCULAR | Status: AC
  Administered 2015-05-18: 12.5 mg via INTRAVENOUS
  Filled 2015-05-18: qty 1

## 2015-05-18 MED ORDER — DEXTROSE 5 % IV SOLN
1.0000 g | Freq: Once | INTRAVENOUS | Status: AC
  Administered 2015-05-18: 1 g via INTRAVENOUS
  Filled 2015-05-18: qty 10

## 2015-05-18 NOTE — ED Notes (Signed)
Pt c/o of generalized body aches, n/d, fever, and headache x 3 days. Pt hx of Cdiff.

## 2015-05-18 NOTE — Discharge Instructions (Signed)

## 2015-05-21 LAB — URINE CULTURE: Culture: >=100000

## 2015-05-23 ENCOUNTER — Telehealth (HOSPITAL_COMMUNITY): Payer: Self-pay

## 2015-05-23 NOTE — Telephone Encounter (Signed)
Post ED Visit - Positive Culture Follow-up  Culture report reviewed by antimicrobial stewardship pharmacist:  []  Celedonio MiyamotoJeremy Frens, Pharm.D., BCPS []  Georgina PillionElizabeth Martin, Pharm.D., BCPS []  KnightsvilleMinh Pham, [address]Ph[address]D., BCPS, AAHIVP [x]  Estella HuskMichelle Turner, Pharm.D., BCPS, AAHIVP []  Cristy Reyes, [address]Pharm[address] []  Cassie Roseanne RenoStewart, [place]arm[place]D.  Positive urine culture, >/= 100,000 colonies -> E Coli Treated with Cephalexin, organism sensitive to the same and no further patient follow-up is required at this time.  Arvid RightClark, Patton Rabinovich Dorn 05/23/2015, 4:47 AM

## 2015-05-28 NOTE — ED Provider Notes (Signed)
CSN: [id number]     Arrival date & time 05/18/15  1223 History   First MD Initiated Contact with Patient 05/18/15 1231     Chief Complaint  Patient presents with  . Generalized Body Aches     (Consider location/radiation/quality/duration/timing/severity/associated sxs/prior Treatment) HPI   34 year old female with generalized body aches. Onset about 3 days ago. Nausea, but no vomiting. Several loose stools. No blood in them. No urinary complaints. No cough. Mild headache. No sick contacts.   Past Medical History  Diagnosis Date  . Tobacco abuse 12/13/2012  . E. coli UTI 12/11/2012  . Acute pyelonephritis 12/11/2012  . Hypotension 12/11/2012.    ?early sepsis/dehydration  . C. difficile colitis    Past Surgical History  Procedure Laterality Date  . Tubal ligation     No family history on file. Social History  Substance Use Topics  . Smoking status: Current Every Day Smoker -- 1.00 packs/day for 15 years  . Smokeless tobacco: None  . Alcohol Use: No   OB History    No data available     Review of Systems  All systems reviewed and negative, other than as noted in HPI.   Allergies  Clindamycin/lincomycin; Flagyl; and Hydrocodone  Home Medications   Prior to Admission medications   Medication Sig Start Date End Date Taking? Authorizing Provider  acetaminophen (TYLENOL) 160 MG/5ML suspension Take 320 mg by mouth every 6 (six) hours as needed for mild pain.   Yes Historical Provider, MD  cyclobenzaprine (FLEXERIL) 10 MG tablet Take 10 mg by mouth 3 (three) times daily as needed for muscle spasms.   Yes Historical Provider, MD  zolpidem (AMBIEN) 10 MG tablet Take 10 mg by mouth at bedtime as needed for sleep.   Yes Historical Provider, MD  cephALEXin (KEFLEX) 500 MG capsule Take 1 capsule (500 mg total) by mouth 4 (four) times daily. 05/18/15   Raeford RazorStephen Adin Laker, MD  traMADol (ULTRAM) 50 MG tablet Take 1 tablet (50 mg total) by mouth every 6 (six) hours as needed. 05/18/15    Raeford RazorStephen Atalya Dano, MD   BP 110/62 mmHg  Pulse 84  Temp(Src) 98.4 F (36.9 C) (Oral)  Resp 16  Ht 5\' 4"  (1.626 m)  Wt 200 lb (90.719 kg)  BMI 34.31 kg/m2  SpO2 100%  LMP 05/06/2015 (Exact Date) Physical Exam  Constitutional: She appears well-developed and well-nourished. No distress.  Laying in bed. Appears tired, but not toxic.  HENT:  Head: Normocephalic and atraumatic.  Eyes: Conjunctivae are normal. Right eye exhibits no discharge. Left eye exhibits no discharge.  Neck: Neck supple.  Cardiovascular: Regular rhythm and normal heart sounds.  Exam reveals no gallop and no friction rub.   No murmur heard. Mild tachycardia.  Pulmonary/Chest: Effort normal and breath sounds normal. No respiratory distress.  Abdominal: Soft. She exhibits no distension. There is no tenderness.  Musculoskeletal: She exhibits no edema or tenderness.  Neurological: She is alert.  Skin: Skin is warm and dry.  Psychiatric: She has a normal mood and affect. Her behavior is normal. Thought content normal.  Nursing note and vitals reviewed.   ED Course  Procedures (including critical care time) Labs Review Labs Reviewed  CBC WITH DIFFERENTIAL/PLATELET - Abnormal; Notable for the following:    WBC 13.3 (*)    Neutro Abs 10.7 (*)    Monocytes Absolute 1.1 (*)    All other components within normal limits  COMPREHENSIVE METABOLIC PANEL - Abnormal; Notable for the following:    Glucose, Bld  113 (*)    Calcium 8.7 (*)    All other components within normal limits  URINALYSIS, ROUTINE W REFLEX MICROSCOPIC (NOT AT [facility]) - Abnormal; Notable for the following:    APPearance HAZY (*)    Hgb urine dipstick MODERATE (*)    Protein, ur 30 (*)    Nitrite POSITIVE (*)    Leukocytes, UA TRACE (*)    All other components within normal limits  URINE MICROSCOPIC-ADD ON - Abnormal; Notable for the following:    Squamous Epithelial / LPF MANY (*)    Bacteria, UA MANY (*)    All other components within normal limits   URINE CULTURE  PREGNANCY, URINE    Imaging Review No results found. I have personally reviewed and evaluated these images and lab results as part of my medical decision-making.   EKG Interpretation None      MDM   Final diagnoses:  Body aches  UTI (lower urinary tract infection)    UTI. I feel appropriate for outpatient treatment. Tachycardia has resolved. Nontoxic. Return precautions discussed.    Raeford Razor, MD 05/28/15 2249

## 2017-07-22 HISTORY — DX: Hemochromatosis, unspecified: E83.119

## 2018-07-08 ENCOUNTER — Encounter (HOSPITAL_COMMUNITY): Payer: Self-pay | Admitting: Emergency Medicine

## 2018-07-08 ENCOUNTER — Other Ambulatory Visit: Payer: Self-pay

## 2018-07-08 ENCOUNTER — Emergency Department (HOSPITAL_COMMUNITY)
Admission: EM | Admit: 2018-07-08 | Discharge: 2018-07-08 | Disposition: A | Discharge status: Home / Self Care | Payer: No Typology Code available for payment source | Attending: Emergency Medicine | Admitting: Emergency Medicine

## 2018-07-08 ENCOUNTER — Emergency Department (HOSPITAL_COMMUNITY): Payer: No Typology Code available for payment source

## 2018-07-08 DIAGNOSIS — R053 Chronic cough: Secondary | ICD-10-CM

## 2018-07-08 DIAGNOSIS — Z79899 Other long term (current) drug therapy: Secondary | ICD-10-CM | POA: Insufficient documentation

## 2018-07-08 DIAGNOSIS — R05 Cough: Secondary | ICD-10-CM | POA: Insufficient documentation

## 2018-07-08 DIAGNOSIS — F17210 Nicotine dependence, cigarettes, uncomplicated: Secondary | ICD-10-CM | POA: Insufficient documentation

## 2018-07-08 HISTORY — DX: Hemochromatosis, unspecified: E83.119

## 2018-07-08 MED ORDER — PROMETHAZINE-CODEINE 6.25-10 MG/5ML PO SYRP: 5.0000 mL | ORAL_SOLUTION | ORAL | 0 refills | Status: AC | PRN

## 2018-07-08 MED ORDER — IBUPROFEN 800 MG PO TABS
800.0000 mg | ORAL_TABLET | Freq: Once | ORAL | Status: AC
Start: 2018-07-08 — End: 2018-07-08
  Administered 2018-07-08: 800 mg via ORAL
  Filled 2018-07-08: qty 1

## 2018-07-08 MED ORDER — BENZONATATE 100 MG PO CAPS
200.0000 mg | ORAL_CAPSULE | Freq: Once | ORAL | Status: AC
  Administered 2018-07-08: 200 mg via ORAL
  Filled 2018-07-08: qty 2

## 2018-07-08 MED ORDER — BENZONATATE 100 MG PO CAPS: 200.0000 mg | ORAL_CAPSULE | Freq: Three times a day (TID) | ORAL | 0 refills | Status: DC | PRN

## 2018-07-08 MED ORDER — IBUPROFEN 600 MG PO TABS: 600.0000 mg | ORAL_TABLET | Freq: Four times a day (QID) | ORAL | 0 refills | Status: DC | PRN

## 2018-07-08 MED ORDER — BENZONATATE 100 MG PO CAPS: 200.0000 mg | ORAL_CAPSULE | Freq: Three times a day (TID) | ORAL | 0 refills | Status: AC | PRN

## 2018-07-08 MED ORDER — IBUPROFEN 600 MG PO TABS: 600.0000 mg | ORAL_TABLET | Freq: Four times a day (QID) | ORAL | 0 refills | Status: AC | PRN

## 2018-07-08 NOTE — Discharge Instructions (Addendum)
Sometimes a chronic cough is the hardest symptom to get rid of and can be fairly long-lasting.  I recommend resting your voice is much as possible as discussed, whispering if needed but try to avoid this as well.  Continue taking warm tea, a teaspoon of honey can be an excellent cough suppressant.  Ibuprofen should help with the throat and vocal cord irritation and inflammation.  You have been prescribed Phenergan with codeine which is a cough suppressant but will make you drowsy, you may need to use this medicine while at home and prior to bedtime only so that you can get better sleep.  Since you now have this tickle which seems to be the source of your cough, I believe Tessalon will provide better cough relief.  A teaspoon of honey is also an excellent cough suppressant.  Make sure you are drinking plenty of fluids.  Cough lozenges as needed.  Plan follow-up with your primary doctor if symptoms persist despite today's treatment.  Your chest x-ray is clear.

## 2018-07-08 NOTE — ED Triage Notes (Signed)
Pt reports she has had a chronic cough x 3 mos that she has seen her PCP for and tried 3 abx, tessalon pearls and cough meds with no relief, pt reports she has been unable to sleep the past 2 nights and today at work got really hot and felt as if she might pass out from coughing

## 2018-07-08 NOTE — ED Provider Notes (Signed)
[facility] EMERGENCY DEPARTMENT Provider Note   CSN: [id number] Arrival date & time: 07/08/18  1318     History   Chief Complaint Chief Complaint  Patient presents with  . Cough    HPI Tonya Stewart is a 37 y.o. female with a history of hemochromatosis under the care of oncology at [facility] with her first had her first infusion treatment about 6 weeks ago presenting with chronic cough which has been present for about the same length of time.  Initially her symptoms were associated with a productive cough, yellow to clear-tinged and she has undergone 3 antibiotic courses including most recently a 10-day course of doxycycline in addition to various cough syrups and Tessalon and most recently had a 6-day prednisone taper.  Her symptoms were almost resolved after this last treatment with prednisone, but has flared in the past week.  Her cough is different and that it is triggered by a tickle sensation causing cough episodes with posttussive emesis x1 yesterday.  Her cough is worse at night.  She denies wheezing, fevers.  She does endorse shortness of breath when the cough becomes severe.  She also has hoarseness of voice which has been chronic and ongoing.  Patient is a smoker, but has barely smoked as her symptoms worsen when she tries.  She denies nausea, abdominal pain, chest pain, also no peripheral edema or painful extremities.   The history is provided by the patient.    Past Medical History:  Diagnosis Date  . Acute pyelonephritis 12/11/2012  . C. difficile colitis   . E. coli UTI 12/11/2012  . Hemochromatosis 2019  . Hypotension 12/11/2012.   ?early sepsis/dehydration  . Tobacco abuse 12/13/2012    Patient Active Problem List   Diagnosis Date Noted  . Lung nodule 05/15/2013  . Leukocytosis 05/14/2013  . Enteritis due to Clostridium difficile 05/12/2013  . Bradycardia 05/11/2013  . Lactic acidosis 05/11/2013  . Tobacco abuse 12/13/2012  . Diarrhea 12/12/2012  .  Sepsis (HCC) 12/12/2012  . Nausea and vomiting 12/11/2012  . Acute pyelonephritis 12/11/2012  . E. coli UTI 12/11/2012  . Dehydration 12/11/2012  . Hypokalemia 12/11/2012  . Abdominal pain, generalized 12/11/2012    Past Surgical History:  Procedure Laterality Date  . TUBAL LIGATION       OB History   No obstetric history on file.      Home Medications    Prior to Admission medications   Medication Sig Start Date End Date Taking? Authorizing Provider  acetaminophen (TYLENOL) 160 MG/5ML suspension Take 320 mg by mouth every 6 (six) hours as needed for mild pain.    [provider]  benzonatate (TESSALON) 100 MG capsule Take 2 capsules (200 mg total) by mouth 3 (three) times daily as needed. 07/08/18   Burgess Amor, PA-C  cephALEXin (KEFLEX) 500 MG capsule Take 1 capsule (500 mg total) by mouth 4 (four) times daily. 05/18/15   Raeford Razor, MD  cyclobenzaprine (FLEXERIL) 10 MG tablet Take 10 mg by mouth 3 (three) times daily as needed for muscle spasms.    [provider]  ibuprofen (ADVIL,MOTRIN) 600 MG tablet Take 1 tablet (600 mg total) by mouth every 6 (six) hours as needed. 07/08/18   Burgess Amor, PA-C  promethazine-codeine (PHENERGAN WITH CODEINE) 6.25-10 MG/5ML syrup Take 5 mLs by mouth every 4 (four) hours as needed for cough. 07/08/18   Burgess Amor, PA-C  traMADol (ULTRAM) 50 MG tablet Take 1 tablet (50 mg total) by mouth every  6 (six) hours as needed. 05/18/15   Raeford RazorKohut, Stephen, MD  zolpidem (AMBIEN) 10 MG tablet Take 10 mg by mouth at bedtime as needed for sleep.    [provider]    Family History History reviewed. No pertinent family history.  Social History Social History   Tobacco Use  . Smoking status: Current Every Day Smoker    Packs/day: 1.00    Years: 15.00    Pack years: 15.00  . Smokeless tobacco: Never Used  Substance Use Topics  . Alcohol use: No  . Drug use: No     Allergies   Clindamycin/lincomycin; Flagyl  [metronidazole]; and Hydrocodone   Review of Systems Review of Systems  Constitutional: Negative for chills and fever.  HENT: Positive for sore throat and voice change. Negative for congestion, ear pain, rhinorrhea, sinus pressure and trouble swallowing.   Eyes: Negative for discharge.  Respiratory: Positive for cough and shortness of breath. Negative for wheezing and stridor.   Cardiovascular: Negative for chest pain.  Gastrointestinal: Negative for abdominal pain and nausea.       Posttussive emesis only.  Genitourinary: Negative.   Musculoskeletal: Negative.  Negative for arthralgias.     Physical Exam Updated Vital Signs BP 103/78 (BP Location: Right Arm)   Pulse (!) 106   Temp 98.7 F (37.1 C) (Oral)   Resp 18   Ht 5\' 6"  (1.676 m)   Wt 110.7 kg   LMP 06/17/2018   SpO2 99%   BMI 39.38 kg/m   Physical Exam Constitutional:      Appearance: She is well-developed.  HENT:     Head: Normocephalic and atraumatic.     Comments: Hoarseness of voice. No stridor. No wheeze    Right Ear: Tympanic membrane and ear canal normal.     Left Ear: Tympanic membrane and ear canal normal.     Nose: Mucosal edema present. No rhinorrhea.     Mouth/Throat:     Pharynx: Uvula midline. No oropharyngeal exudate or posterior oropharyngeal erythema.     Tonsils: No tonsillar abscesses.  Eyes:     Conjunctiva/sclera: Conjunctivae normal.  Neck:     Musculoskeletal: Normal range of motion.     Comments: Hoarseness of voice. Cardiovascular:     Rate and Rhythm: Tachycardia present.     Heart sounds: Normal heart sounds.  Pulmonary:     Effort: Pulmonary effort is normal. No respiratory distress.     Breath sounds: No stridor. No wheezing, rhonchi or rales.     Comments: Coarse breath sounds. Frequent dry cough.  Abdominal:     Palpations: Abdomen is soft.     Tenderness: There is no abdominal tenderness.  Musculoskeletal: Normal range of motion.  Lymphadenopathy:     Cervical: No  cervical adenopathy.  Skin:    General: Skin is warm and dry.     Findings: No rash.  Neurological:     Mental Status: She is alert and oriented to person, place, and time.      ED Treatments / Results  Labs (all labs ordered are listed, but only abnormal results are displayed) Labs Reviewed - No data to display  EKG None  Radiology Dg Chest 2 View  Result Date: 07/08/2018 CLINICAL DATA:  Cough with wheezing and shortness of breath EXAM: CHEST - 2 VIEW COMPARISON:  May 14, 2013 FINDINGS: The lungs are clear. The heart size and pulmonary vascularity are normal. No adenopathy. No bone lesions. IMPRESSION: No edema or consolidation. Electronically Signed  By: Bretta Bang III M.D.   On: 07/08/2018 13:47    Procedures Procedures (including critical care time)  Medications Ordered in ED Medications  benzonatate (TESSALON) capsule 200 mg (200 mg Oral Given 07/08/18 1450)  ibuprofen (ADVIL,MOTRIN) tablet 800 mg (800 mg Oral Given 07/08/18 1450)     Initial Impression / Assessment and Plan / ED Course  I have reviewed the triage vital signs and the nursing notes.  Pertinent labs & imaging results that were available during my care of the patient were reviewed by me and considered in my medical decision making (see chart for details).     Pt with acute on chronic cough, now changed this week with tickle as the trigger, productivity gone. No longer has nasal dc or pnd. Denies heartburn/reflux.  She is a 1 ppd smoker, reduced due to cough trigger.  CXR negative. Pt is a smoker but has had 3 rounds of abx without improvement. Suspect tessalon will help break the cough cycle, also prescribed phenergan with codeine and ibuprofen. Discussed other home tx for cough, honey, increased fluids, cough lozenges.  Encouraged smoking cessation. Prn f/u with pcp if sx persist or worsen.  Final Clinical Impressions(s) / ED Diagnoses   Final diagnoses:  Chronic cough    ED Discharge  Orders         Ordered    benzonatate (TESSALON) 100 MG capsule  3 times daily PRN,   Status:  Discontinued     07/08/18 1438    promethazine-codeine (PHENERGAN WITH CODEINE) 6.25-10 MG/5ML syrup  Every 4 hours PRN     07/08/18 1438    ibuprofen (ADVIL,MOTRIN) 600 MG tablet  Every 6 hours PRN,   Status:  Discontinued     07/08/18 1438    benzonatate (TESSALON) 100 MG capsule  3 times daily PRN     07/08/18 1508    ibuprofen (ADVIL,MOTRIN) 600 MG tablet  Every 6 hours PRN     07/08/18 1508           Burgess Amor, PA-C 07/08/18 2122    Loren Racer, MD 07/10/18 1059
